# Patient Record
Sex: Male | Born: 1945 | Race: White | Hispanic: No | Marital: Married | Smoking: Never smoker
Health system: Southern US, Community
[De-identification: ages and names within clinical notes are randomized; demographics above are authoritative.]

## PROBLEM LIST (undated history)

## (undated) DIAGNOSIS — I10 Essential (primary) hypertension: Secondary | ICD-10-CM

## (undated) DIAGNOSIS — I499 Cardiac arrhythmia, unspecified: Secondary | ICD-10-CM

## (undated) DIAGNOSIS — L0390 Cellulitis, unspecified: Secondary | ICD-10-CM

## (undated) DIAGNOSIS — Z9889 Other specified postprocedural states: Secondary | ICD-10-CM

## (undated) DIAGNOSIS — I2510 Atherosclerotic heart disease of native coronary artery without angina pectoris: Secondary | ICD-10-CM

## (undated) DIAGNOSIS — S8992XA Unspecified injury of left lower leg, initial encounter: Secondary | ICD-10-CM

## (undated) DIAGNOSIS — I82409 Acute embolism and thrombosis of unspecified deep veins of unspecified lower extremity: Secondary | ICD-10-CM

## (undated) DIAGNOSIS — K219 Gastro-esophageal reflux disease without esophagitis: Secondary | ICD-10-CM

## (undated) DIAGNOSIS — G20A1 Parkinson's disease without dyskinesia, without mention of fluctuations: Secondary | ICD-10-CM

## (undated) DIAGNOSIS — J302 Other seasonal allergic rhinitis: Secondary | ICD-10-CM

## (undated) DIAGNOSIS — Z8679 Personal history of other diseases of the circulatory system: Secondary | ICD-10-CM

## (undated) DIAGNOSIS — J45909 Unspecified asthma, uncomplicated: Secondary | ICD-10-CM

## (undated) DIAGNOSIS — E119 Type 2 diabetes mellitus without complications: Secondary | ICD-10-CM

## (undated) DIAGNOSIS — I219 Acute myocardial infarction, unspecified: Secondary | ICD-10-CM

## (undated) DIAGNOSIS — G20 Parkinson's disease: Secondary | ICD-10-CM

## (undated) DIAGNOSIS — Z950 Presence of cardiac pacemaker: Secondary | ICD-10-CM

## (undated) HISTORY — PX: CARDIAC CATHETERIZATION: SHX172

## (undated) HISTORY — PX: HERNIA REPAIR: SHX51

## (undated) HISTORY — PX: APPENDECTOMY: SHX54

## (undated) HISTORY — PX: COLONOSCOPY WITH PROPOFOL: SHX5780

## (undated) HISTORY — PX: IVC FILTER PLACEMENT (ARMC HX): HXRAD1551

## (undated) HISTORY — PX: CORONARY ARTERY BYPASS GRAFT: SHX141

## (undated) HISTORY — PX: CAROTID ENDARTERECTOMY: SUR193

## (undated) HISTORY — PX: VASCULAR SURGERY: SHX849

---

## 2008-01-15 ENCOUNTER — Ambulatory Visit: Payer: Self-pay | Admitting: Gastroenterology

## 2010-09-13 ENCOUNTER — Inpatient Hospital Stay: Payer: Self-pay | Admitting: Internal Medicine

## 2010-09-28 ENCOUNTER — Other Ambulatory Visit: Payer: Self-pay | Admitting: Internal Medicine

## 2010-12-09 HISTORY — PX: PACEMAKER INSERTION: SHX728

## 2011-08-23 ENCOUNTER — Inpatient Hospital Stay: Payer: Self-pay | Admitting: Internal Medicine

## 2011-10-15 ENCOUNTER — Encounter: Payer: Self-pay | Admitting: Cardiology

## 2011-11-09 ENCOUNTER — Encounter: Payer: Self-pay | Admitting: Cardiology

## 2011-11-12 DIAGNOSIS — I82409 Acute embolism and thrombosis of unspecified deep veins of unspecified lower extremity: Secondary | ICD-10-CM | POA: Insufficient documentation

## 2011-11-12 DIAGNOSIS — I495 Sick sinus syndrome: Secondary | ICD-10-CM | POA: Insufficient documentation

## 2011-11-12 DIAGNOSIS — K635 Polyp of colon: Secondary | ICD-10-CM | POA: Insufficient documentation

## 2011-11-12 DIAGNOSIS — I4892 Unspecified atrial flutter: Secondary | ICD-10-CM | POA: Insufficient documentation

## 2011-11-12 DIAGNOSIS — I480 Paroxysmal atrial fibrillation: Secondary | ICD-10-CM | POA: Insufficient documentation

## 2011-11-12 DIAGNOSIS — Z86718 Personal history of other venous thrombosis and embolism: Secondary | ICD-10-CM | POA: Insufficient documentation

## 2011-11-12 DIAGNOSIS — J45909 Unspecified asthma, uncomplicated: Secondary | ICD-10-CM | POA: Insufficient documentation

## 2011-11-12 DIAGNOSIS — K2100 Gastro-esophageal reflux disease with esophagitis, without bleeding: Secondary | ICD-10-CM | POA: Insufficient documentation

## 2011-11-12 DIAGNOSIS — J449 Chronic obstructive pulmonary disease, unspecified: Secondary | ICD-10-CM | POA: Insufficient documentation

## 2011-11-12 DIAGNOSIS — I2510 Atherosclerotic heart disease of native coronary artery without angina pectoris: Secondary | ICD-10-CM | POA: Insufficient documentation

## 2011-11-12 DIAGNOSIS — E1122 Type 2 diabetes mellitus with diabetic chronic kidney disease: Secondary | ICD-10-CM | POA: Insufficient documentation

## 2011-11-12 DIAGNOSIS — Z794 Long term (current) use of insulin: Secondary | ICD-10-CM | POA: Insufficient documentation

## 2011-11-12 DIAGNOSIS — Z950 Presence of cardiac pacemaker: Secondary | ICD-10-CM | POA: Insufficient documentation

## 2011-11-12 DIAGNOSIS — E785 Hyperlipidemia, unspecified: Secondary | ICD-10-CM | POA: Insufficient documentation

## 2011-11-12 DIAGNOSIS — R251 Tremor, unspecified: Secondary | ICD-10-CM | POA: Insufficient documentation

## 2011-11-13 DIAGNOSIS — G4733 Obstructive sleep apnea (adult) (pediatric): Secondary | ICD-10-CM | POA: Insufficient documentation

## 2011-12-10 ENCOUNTER — Encounter: Payer: Self-pay | Admitting: Cardiology

## 2012-01-10 ENCOUNTER — Encounter: Payer: Self-pay | Admitting: Cardiology

## 2012-01-31 ENCOUNTER — Other Ambulatory Visit: Payer: Self-pay | Admitting: Vascular Surgery

## 2012-01-31 LAB — CREATININE, SERUM: EGFR (Non-African Amer.): 56 — ABNORMAL LOW

## 2012-02-03 ENCOUNTER — Ambulatory Visit: Payer: Self-pay | Admitting: Vascular Surgery

## 2012-02-07 ENCOUNTER — Encounter: Payer: Self-pay | Admitting: Cardiology

## 2012-03-03 ENCOUNTER — Ambulatory Visit: Payer: Self-pay | Admitting: Neurology

## 2012-03-09 ENCOUNTER — Encounter: Payer: Self-pay | Admitting: Cardiology

## 2012-05-30 ENCOUNTER — Emergency Department: Payer: Self-pay | Admitting: Emergency Medicine

## 2012-05-30 LAB — URINALYSIS, COMPLETE
Bacteria: NONE SEEN
Bilirubin,UR: NEGATIVE
Blood: NEGATIVE
Glucose,UR: NEGATIVE mg/dL (ref 0–75)
Ketone: NEGATIVE
Leukocyte Esterase: NEGATIVE
Nitrite: NEGATIVE
Protein: NEGATIVE
Specific Gravity: 1.014 (ref 1.003–1.030)
Squamous Epithelial: <1
WBC UR: 2 /HPF (ref 0–5)

## 2013-11-30 ENCOUNTER — Ambulatory Visit: Payer: Self-pay | Admitting: Vascular Surgery

## 2013-12-27 ENCOUNTER — Ambulatory Visit: Payer: Self-pay | Admitting: Vascular Surgery

## 2013-12-27 LAB — BASIC METABOLIC PANEL
ANION GAP: 3 — AB (ref 7–16)
BUN: 22 mg/dL — AB (ref 7–18)
CALCIUM: 8.8 mg/dL (ref 8.5–10.1)
CO2: 28 mmol/L (ref 21–32)
Chloride: 103 mmol/L (ref 98–107)
Creatinine: 1.02 mg/dL (ref 0.60–1.30)
EGFR (African American): >60
EGFR (Non-African Amer.): >60
Glucose: 240 mg/dL — ABNORMAL HIGH (ref 65–99)
OSMOLALITY: 279 (ref 275–301)
POTASSIUM: 5 mmol/L (ref 3.5–5.1)
Sodium: 134 mmol/L — ABNORMAL LOW (ref 136–145)

## 2013-12-27 LAB — CBC
HCT: 40.4 % (ref 40.0–52.0)
HGB: 13.4 g/dL (ref 13.0–18.0)
MCH: 30.1 pg (ref 26.0–34.0)
MCHC: 33.1 g/dL (ref 32.0–36.0)
MCV: 91 fL (ref 80–100)
PLATELETS: 171 10*3/uL (ref 150–440)
RBC: 4.44 10*6/uL (ref 4.40–5.90)
RDW: 14.7 % — ABNORMAL HIGH (ref 11.5–14.5)
WBC: 7.8 10*3/uL (ref 3.8–10.6)

## 2014-01-05 ENCOUNTER — Inpatient Hospital Stay: Payer: Self-pay | Admitting: Vascular Surgery

## 2014-01-06 LAB — PATHOLOGY REPORT

## 2014-01-06 LAB — CBC WITH DIFFERENTIAL/PLATELET
Basophil #: 0.1 10*3/uL (ref 0.0–0.1)
Basophil %: 0.5 %
EOS ABS: 0.1 10*3/uL (ref 0.0–0.7)
Eosinophil %: 1.4 %
HCT: 35.3 % — ABNORMAL LOW (ref 40.0–52.0)
HGB: 11.9 g/dL — ABNORMAL LOW (ref 13.0–18.0)
LYMPHS ABS: 1.6 10*3/uL (ref 1.0–3.6)
LYMPHS PCT: 14.8 %
MCH: 30.7 pg (ref 26.0–34.0)
MCHC: 33.8 g/dL (ref 32.0–36.0)
MCV: 91 fL (ref 80–100)
MONO ABS: 0.7 x10 3/mm (ref 0.2–1.0)
Monocyte %: 6.8 %
Neutrophil #: 8 10*3/uL — ABNORMAL HIGH (ref 1.4–6.5)
Neutrophil %: 76.5 %
Platelet: 137 10*3/uL — ABNORMAL LOW (ref 150–440)
RBC: 3.89 10*6/uL — AB (ref 4.40–5.90)
RDW: 14.7 % — AB (ref 11.5–14.5)
WBC: 10.4 10*3/uL (ref 3.8–10.6)

## 2014-01-06 LAB — BASIC METABOLIC PANEL
ANION GAP: 3 — AB (ref 7–16)
BUN: 12 mg/dL (ref 7–18)
CHLORIDE: 105 mmol/L (ref 98–107)
CO2: 28 mmol/L (ref 21–32)
CREATININE: 0.91 mg/dL (ref 0.60–1.30)
Calcium, Total: 7.9 mg/dL — ABNORMAL LOW (ref 8.5–10.1)
Glucose: 147 mg/dL — ABNORMAL HIGH (ref 65–99)
OSMOLALITY: 274 (ref 275–301)
POTASSIUM: 3.8 mmol/L (ref 3.5–5.1)
Sodium: 136 mmol/L (ref 136–145)

## 2014-01-06 LAB — APTT: ACTIVATED PTT: 28.9 s (ref 23.6–35.9)

## 2014-01-06 LAB — PROTIME-INR
INR: 1.1
Prothrombin Time: 14.3 secs (ref 11.5–14.7)

## 2014-04-07 DIAGNOSIS — Z7901 Long term (current) use of anticoagulants: Secondary | ICD-10-CM | POA: Insufficient documentation

## 2014-10-09 DIAGNOSIS — I255 Ischemic cardiomyopathy: Secondary | ICD-10-CM | POA: Insufficient documentation

## 2014-11-01 ENCOUNTER — Observation Stay: Payer: Self-pay | Admitting: Surgery

## 2014-11-10 ENCOUNTER — Encounter: Payer: Self-pay | Admitting: Surgery

## 2014-12-06 ENCOUNTER — Encounter: Payer: Self-pay | Admitting: General Surgery

## 2014-12-09 ENCOUNTER — Encounter: Payer: Self-pay | Admitting: General Surgery

## 2014-12-09 ENCOUNTER — Encounter: Payer: Self-pay | Admitting: Surgery

## 2014-12-27 ENCOUNTER — Encounter: Payer: Self-pay | Admitting: Surgery

## 2014-12-30 ENCOUNTER — Encounter (HOSPITAL_BASED_OUTPATIENT_CLINIC_OR_DEPARTMENT_OTHER): Payer: Self-pay | Admitting: *Deleted

## 2015-01-02 ENCOUNTER — Other Ambulatory Visit: Payer: Self-pay | Admitting: Plastic Surgery

## 2015-01-02 DIAGNOSIS — L97923 Non-pressure chronic ulcer of unspecified part of left lower leg with necrosis of muscle: Secondary | ICD-10-CM

## 2015-01-04 ENCOUNTER — Ambulatory Visit (HOSPITAL_BASED_OUTPATIENT_CLINIC_OR_DEPARTMENT_OTHER): Payer: Medicare PPO | Admitting: Anesthesiology

## 2015-01-04 ENCOUNTER — Encounter (HOSPITAL_BASED_OUTPATIENT_CLINIC_OR_DEPARTMENT_OTHER): Payer: Self-pay | Admitting: Plastic Surgery

## 2015-01-04 ENCOUNTER — Encounter (HOSPITAL_BASED_OUTPATIENT_CLINIC_OR_DEPARTMENT_OTHER)
Admission: RE | Disposition: A | Discharge status: Home / Self Care | Payer: Commercial Managed Care - HMO | Source: Ambulatory Visit | Attending: Plastic Surgery

## 2015-01-04 ENCOUNTER — Ambulatory Visit (HOSPITAL_BASED_OUTPATIENT_CLINIC_OR_DEPARTMENT_OTHER)
Admission: RE | Admit: 2015-01-04 | Discharge: 2015-01-04 | Disposition: A | Discharge status: Home / Self Care | Payer: Medicare PPO | Source: Ambulatory Visit | Attending: Plastic Surgery | Admitting: Plastic Surgery

## 2015-01-04 DIAGNOSIS — J45909 Unspecified asthma, uncomplicated: Secondary | ICD-10-CM | POA: Diagnosis not present

## 2015-01-04 DIAGNOSIS — I739 Peripheral vascular disease, unspecified: Secondary | ICD-10-CM | POA: Insufficient documentation

## 2015-01-04 DIAGNOSIS — G20 Parkinson's disease: Secondary | ICD-10-CM | POA: Diagnosis not present

## 2015-01-04 DIAGNOSIS — G4730 Sleep apnea, unspecified: Secondary | ICD-10-CM | POA: Insufficient documentation

## 2015-01-04 DIAGNOSIS — L97929 Non-pressure chronic ulcer of unspecified part of left lower leg with unspecified severity: Secondary | ICD-10-CM | POA: Diagnosis present

## 2015-01-04 DIAGNOSIS — I10 Essential (primary) hypertension: Secondary | ICD-10-CM | POA: Insufficient documentation

## 2015-01-04 DIAGNOSIS — K219 Gastro-esophageal reflux disease without esophagitis: Secondary | ICD-10-CM | POA: Insufficient documentation

## 2015-01-04 DIAGNOSIS — L97923 Non-pressure chronic ulcer of unspecified part of left lower leg with necrosis of muscle: Secondary | ICD-10-CM

## 2015-01-04 DIAGNOSIS — Z950 Presence of cardiac pacemaker: Secondary | ICD-10-CM | POA: Diagnosis not present

## 2015-01-04 DIAGNOSIS — I2510 Atherosclerotic heart disease of native coronary artery without angina pectoris: Secondary | ICD-10-CM | POA: Insufficient documentation

## 2015-01-04 DIAGNOSIS — I4891 Unspecified atrial fibrillation: Secondary | ICD-10-CM | POA: Insufficient documentation

## 2015-01-04 DIAGNOSIS — Z888 Allergy status to other drugs, medicaments and biological substances status: Secondary | ICD-10-CM | POA: Diagnosis not present

## 2015-01-04 DIAGNOSIS — E119 Type 2 diabetes mellitus without complications: Secondary | ICD-10-CM | POA: Insufficient documentation

## 2015-01-04 DIAGNOSIS — Z86718 Personal history of other venous thrombosis and embolism: Secondary | ICD-10-CM | POA: Insufficient documentation

## 2015-01-04 HISTORY — DX: Acute embolism and thrombosis of unspecified deep veins of unspecified lower extremity: I82.409

## 2015-01-04 HISTORY — PX: APPLICATION OF A-CELL OF EXTREMITY: SHX6303

## 2015-01-04 HISTORY — PX: I & D EXTREMITY: SHX5045

## 2015-01-04 HISTORY — DX: Essential (primary) hypertension: I10

## 2015-01-04 HISTORY — DX: Gastro-esophageal reflux disease without esophagitis: K21.9

## 2015-01-04 HISTORY — DX: Unspecified asthma, uncomplicated: J45.909

## 2015-01-04 HISTORY — DX: Presence of cardiac pacemaker: Z95.0

## 2015-01-04 HISTORY — DX: Type 2 diabetes mellitus without complications: E11.9

## 2015-01-04 HISTORY — DX: Parkinson's disease without dyskinesia, without mention of fluctuations: G20.A1

## 2015-01-04 HISTORY — DX: Other specified postprocedural states: Z98.890

## 2015-01-04 HISTORY — DX: Personal history of other diseases of the circulatory system: Z86.79

## 2015-01-04 HISTORY — DX: Parkinson's disease: G20

## 2015-01-04 LAB — POCT I-STAT, CHEM 8
BUN: 22 mg/dL (ref 6–23)
CALCIUM ION: 1.2 mmol/L (ref 1.13–1.30)
CREATININE: 0.9 mg/dL (ref 0.50–1.35)
Chloride: 101 mmol/L (ref 96–112)
Glucose, Bld: 190 mg/dL — ABNORMAL HIGH (ref 70–99)
HEMATOCRIT: 41 % (ref 39.0–52.0)
HEMOGLOBIN: 13.9 g/dL (ref 13.0–17.0)
Potassium: 4.5 mmol/L (ref 3.5–5.1)
Sodium: 140 mmol/L (ref 135–145)
TCO2: 25 mmol/L (ref 0–100)

## 2015-01-04 LAB — GLUCOSE, CAPILLARY: Glucose-Capillary: 149 mg/dL — ABNORMAL HIGH (ref 70–99)

## 2015-01-04 SURGERY — IRRIGATION AND DEBRIDEMENT EXTREMITY
Anesthesia: General | Site: Leg Lower | Laterality: Left

## 2015-01-04 MED ORDER — OXYCODONE HCL 5 MG/5ML PO SOLN: 5.0000 mg | Freq: Once | ORAL | Status: DC | PRN

## 2015-01-04 MED ORDER — LACTATED RINGERS IV SOLN
INTRAVENOUS | Status: DC
  Administered 2015-01-04: 12:00:00 via INTRAVENOUS

## 2015-01-04 MED ORDER — HYDROMORPHONE HCL 1 MG/ML IJ SOLN: 0.2500 mg | INTRAMUSCULAR | Status: DC | PRN

## 2015-01-04 MED ORDER — PROPOFOL 10 MG/ML IV BOLUS
INTRAVENOUS | Status: DC | PRN
  Administered 2015-01-04: 150 mg via INTRAVENOUS

## 2015-01-04 MED ORDER — FENTANYL CITRATE 0.05 MG/ML IJ SOLN
INTRAMUSCULAR | Status: AC
  Filled 2015-01-04: qty 4

## 2015-01-04 MED ORDER — OXYCODONE HCL 5 MG PO TABS
5.0000 mg | ORAL_TABLET | Freq: Once | ORAL | Status: DC | PRN
Start: 2015-01-04 — End: 2015-01-04

## 2015-01-04 MED ORDER — MIDAZOLAM HCL 2 MG/2ML IJ SOLN: 1.0000 mg | INTRAMUSCULAR | Status: DC | PRN

## 2015-01-04 MED ORDER — MIDAZOLAM HCL 2 MG/2ML IJ SOLN
INTRAMUSCULAR | Status: AC
  Filled 2015-01-04: qty 2

## 2015-01-04 MED ORDER — LIDOCAINE-EPINEPHRINE 1 %-1:100000 IJ SOLN
INTRAMUSCULAR | Status: AC
  Filled 2015-01-04: qty 1

## 2015-01-04 MED ORDER — BUPIVACAINE-EPINEPHRINE (PF) 0.25% -1:200000 IJ SOLN
INTRAMUSCULAR | Status: AC
  Filled 2015-01-04: qty 30

## 2015-01-04 MED ORDER — FENTANYL CITRATE 0.05 MG/ML IJ SOLN
INTRAMUSCULAR | Status: DC | PRN
  Administered 2015-01-04: 50 ug via INTRAVENOUS

## 2015-01-04 MED ORDER — LIDOCAINE HCL (CARDIAC) 20 MG/ML IV SOLN
INTRAVENOUS | Status: DC | PRN
  Administered 2015-01-04: 50 mg via INTRAVENOUS

## 2015-01-04 MED ORDER — CEFAZOLIN SODIUM-DEXTROSE 2-3 GM-% IV SOLR
2.0000 g | INTRAVENOUS | Status: AC
  Administered 2015-01-04: 2 g via INTRAVENOUS

## 2015-01-04 MED ORDER — MEPERIDINE HCL 25 MG/ML IJ SOLN: 6.2500 mg | INTRAMUSCULAR | Status: DC | PRN

## 2015-01-04 MED ORDER — ONDANSETRON HCL 4 MG/2ML IJ SOLN
INTRAMUSCULAR | Status: DC | PRN
  Administered 2015-01-04: 4 mg via INTRAVENOUS

## 2015-01-04 MED ORDER — FENTANYL CITRATE 0.05 MG/ML IJ SOLN: 50.0000 ug | INTRAMUSCULAR | Status: DC | PRN

## 2015-01-04 MED ORDER — PROMETHAZINE HCL 25 MG/ML IJ SOLN: 6.2500 mg | INTRAMUSCULAR | Status: DC | PRN

## 2015-01-04 MED ORDER — MIDAZOLAM HCL 5 MG/5ML IJ SOLN
INTRAMUSCULAR | Status: DC | PRN
  Administered 2015-01-04: 1 mg via INTRAVENOUS

## 2015-01-04 MED ORDER — SODIUM CHLORIDE 0.9 % IR SOLN
Status: DC | PRN
  Administered 2015-01-04: 500 mL

## 2015-01-04 SURGICAL SUPPLY — 84 items
BAG DECANTER FOR FLEXI CONT (MISCELLANEOUS) ×2 IMPLANT
BANDAGE ELASTIC 3 VELCRO ST LF (GAUZE/BANDAGES/DRESSINGS) IMPLANT
BANDAGE ELASTIC 4 VELCRO ST LF (GAUZE/BANDAGES/DRESSINGS) ×4 IMPLANT
BANDAGE ELASTIC 6 VELCRO ST LF (GAUZE/BANDAGES/DRESSINGS) IMPLANT
BENZOIN TINCTURE PRP APPL 2/3 (GAUZE/BANDAGES/DRESSINGS) IMPLANT
BLADE HEX COATED 2.75 (ELECTRODE) IMPLANT
BLADE MINI RND TIP GREEN BEAV (BLADE) IMPLANT
BLADE SURG 10 STRL SS (BLADE) ×2 IMPLANT
BLADE SURG 15 STRL LF DISP TIS (BLADE) ×1 IMPLANT
BLADE SURG 15 STRL SS (BLADE) ×1
BNDG COHESIVE 1X5 TAN STRL LF (GAUZE/BANDAGES/DRESSINGS) IMPLANT
BNDG COHESIVE 4X5 TAN STRL (GAUZE/BANDAGES/DRESSINGS) IMPLANT
BNDG ESMARK 4X9 LF (GAUZE/BANDAGES/DRESSINGS) IMPLANT
BNDG GAUZE ELAST 4 BULKY (GAUZE/BANDAGES/DRESSINGS) IMPLANT
CANISTER SUCT 1200ML W/VALVE (MISCELLANEOUS) ×2 IMPLANT
CANISTER SUCT 3000ML (MISCELLANEOUS) IMPLANT
CHLORAPREP W/TINT 26ML (MISCELLANEOUS) IMPLANT
CORDS BIPOLAR (ELECTRODE) IMPLANT
COVER BACK TABLE 60X90IN (DRAPES) ×2 IMPLANT
COVER MAYO STAND STRL (DRAPES) ×2 IMPLANT
DECANTER SPIKE VIAL GLASS SM (MISCELLANEOUS) IMPLANT
DRAIN PENROSE 1/2X12 LTX STRL (WOUND CARE) IMPLANT
DRAPE EXTREMITY T 121X128X90 (DRAPE) IMPLANT
DRAPE INCISE IOBAN 66X45 STRL (DRAPES) ×2 IMPLANT
DRAPE U-SHAPE 76X120 STRL (DRAPES) ×2 IMPLANT
DRSG ADAPTIC 3X8 NADH LF (GAUZE/BANDAGES/DRESSINGS) ×2 IMPLANT
DRSG EMULSION OIL 3X3 NADH (GAUZE/BANDAGES/DRESSINGS) IMPLANT
DRSG PAD ABDOMINAL 8X10 ST (GAUZE/BANDAGES/DRESSINGS) IMPLANT
ELECT NEEDLE TIP 2.8 STRL (NEEDLE) IMPLANT
ELECT REM PT RETURN 9FT ADLT (ELECTROSURGICAL)
ELECTRODE REM PT RTRN 9FT ADLT (ELECTROSURGICAL) IMPLANT
GAUZE SPONGE 4X4 12PLY STRL (GAUZE/BANDAGES/DRESSINGS) ×2 IMPLANT
GAUZE XEROFORM 1X8 LF (GAUZE/BANDAGES/DRESSINGS) IMPLANT
GAUZE XEROFORM 5X9 LF (GAUZE/BANDAGES/DRESSINGS) IMPLANT
GLOVE BIO SURGEON STRL SZ 6.5 (GLOVE) ×4 IMPLANT
GLOVE SURG SS PI 7.0 STRL IVOR (GLOVE) ×2 IMPLANT
GOWN STRL REUS W/ TWL LRG LVL3 (GOWN DISPOSABLE) ×3 IMPLANT
GOWN STRL REUS W/TWL LRG LVL3 (GOWN DISPOSABLE) ×3
IV NS IRRIG 3000ML ARTHROMATIC (IV SOLUTION) IMPLANT
MANIFOLD NEPTUNE II (INSTRUMENTS) IMPLANT
MATRIX SURGICAL PSMX 10X15CM (Tissue) ×2 IMPLANT
MICROMATRIX 1000MG (Tissue) ×2 IMPLANT
NEEDLE HYPO 30GX1 BEV (NEEDLE) IMPLANT
NEEDLE PRECISIONGLIDE 27X1.5 (NEEDLE) ×2 IMPLANT
NS IRRIG 1000ML POUR BTL (IV SOLUTION) ×2 IMPLANT
PACK BASIN DAY SURGERY FS (CUSTOM PROCEDURE TRAY) ×2 IMPLANT
PADDING CAST ABS 3INX4YD NS (CAST SUPPLIES)
PADDING CAST ABS 4INX4YD NS (CAST SUPPLIES)
PADDING CAST ABS COTTON 3X4 (CAST SUPPLIES) IMPLANT
PADDING CAST ABS COTTON 4X4 ST (CAST SUPPLIES) IMPLANT
PENCIL BUTTON HOLSTER BLD 10FT (ELECTRODE) IMPLANT
SHEET MEDIUM DRAPE 40X70 STRL (DRAPES) ×2 IMPLANT
SLEEVE SCD COMPRESS KNEE MED (MISCELLANEOUS) ×2 IMPLANT
SOLUTION PARTIC MCRMTRX 1000MG (Tissue) ×1 IMPLANT
SPLINT PLASTER CAST XFAST 3X15 (CAST SUPPLIES) IMPLANT
SPLINT PLASTER XTRA FASTSET 3X (CAST SUPPLIES)
SPONGE GAUZE 4X4 12PLY STER LF (GAUZE/BANDAGES/DRESSINGS) IMPLANT
SPONGE LAP 18X18 X RAY DECT (DISPOSABLE) ×4 IMPLANT
SPONGE LAP 4X18 X RAY DECT (DISPOSABLE) IMPLANT
STAPLER VISISTAT 35W (STAPLE) IMPLANT
STOCKINETTE 4X48 STRL (DRAPES) IMPLANT
STOCKINETTE 6  STRL (DRAPES) ×1
STOCKINETTE 6 STRL (DRAPES) ×1 IMPLANT
STOCKINETTE IMPERVIOUS LG (DRAPES) IMPLANT
STRIP CLOSURE SKIN 1/2X4 (GAUZE/BANDAGES/DRESSINGS) IMPLANT
SUCTION FRAZIER TIP 10 FR DISP (SUCTIONS) IMPLANT
SURGILUBE 2OZ TUBE FLIPTOP (MISCELLANEOUS) ×2 IMPLANT
SUT ETHILON 3 0 PS 1 (SUTURE) IMPLANT
SUT ETHILON 4 0 P 3 18 (SUTURE) IMPLANT
SUT ETHILON 5 0 PS 2 18 (SUTURE) IMPLANT
SUT PROLENE 3 0 PS 2 (SUTURE) IMPLANT
SUT SILK 3 0 PS 1 (SUTURE) ×2 IMPLANT
SUT VIC AB 3-0 FS2 27 (SUTURE) IMPLANT
SUT VIC AB 5-0 P-3 18X BRD (SUTURE) IMPLANT
SUT VIC AB 5-0 P3 18 (SUTURE)
SUT VIC AB 5-0 PS2 18 (SUTURE) ×6 IMPLANT
SYR BULB IRRIGATION 50ML (SYRINGE) ×2 IMPLANT
SYR CONTROL 10ML LL (SYRINGE) ×2 IMPLANT
TAPE HYPAFIX 6X30 (GAUZE/BANDAGES/DRESSINGS) IMPLANT
TOWEL OR 17X24 6PK STRL BLUE (TOWEL DISPOSABLE) ×2 IMPLANT
TRAY DSU PREP LF (CUSTOM PROCEDURE TRAY) ×2 IMPLANT
TUBE CONNECTING 20X1/4 (TUBING) ×2 IMPLANT
UNDERPAD 30X30 INCONTINENT (UNDERPADS AND DIAPERS) ×2 IMPLANT
YANKAUER SUCT BULB TIP NO VENT (SUCTIONS) ×2 IMPLANT

## 2015-01-04 NOTE — Discharge Instructions (Signed)
Do not remove VAC     Post Anesthesia Home Care Instructions  Activity: Get plenty of rest for the remainder of the day. A responsible adult should stay with you for 24 hours following the procedure.  For the next 24 hours, DO NOT: -Drive a car -Advertising copywriterperate machinery -Drink alcoholic beverages -Take any medication unless instructed by your physician -Make any legal decisions or sign important papers.  Meals: Start with liquid foods such as gelatin or soup. Progress to regular foods as tolerated. Avoid greasy, spicy, heavy foods. If nausea and/or vomiting occur, drink only clear liquids until the nausea and/or vomiting subsides. Call your physician if vomiting continues.  Special Instructions/Symptoms: Your throat may feel dry or sore from the anesthesia or the breathing tube placed in your throat during surgery. If this causes discomfort, gargle with warm salt water. The discomfort should disappear within 24 hours.   Call your surgeon if you experience:   1.  Fever over 101.0. 2.  Inability to urinate. 3.  Nausea and/or vomiting. 4.  Extreme swelling or bruising at the surgical site. 5.  Continued bleeding from the incision. 6.  Increased pain, redness or drainage from the incision. 7.  Problems related to your pain medication. 8. Any change in color, movement and/or sensation 9. Any problems and/or concerns

## 2015-01-04 NOTE — Interval H&P Note (Signed)
History and Physical Interval Note:  01/04/2015 11:19 AM  Ian [name]  has presented today for surgery, with the diagnosis of LOWER LEFT LEG ULCER  The various methods of treatment have been discussed with the patient and family. After consideration of risks, benefits and other options for treatment, the patient has consented to  Procedure(s): IRRIGATION AND DEBRIDEMENT OF LOWER LEFT LEG WOUND WITH A CELL AND VAC (Left) APPLICATION OF A-CELL  AND VAC  (Left) as a surgical intervention .  The patient's history has been reviewed, patient examined, no change in status, stable for surgery.  I have reviewed the patient's chart and labs.  Questions were answered to the patient's satisfaction.     SANGER,Nataleah Scioneaux

## 2015-01-04 NOTE — Op Note (Signed)
Operative Note   DATE OF OPERATION: 01/04/2015  [facility]  SURGICAL DIVISION: Plastic Surgery  PREOPERATIVE DIAGNOSES:  Left lateral leg ulcer  POSTOPERATIVE DIAGNOSES:  same  PROCEDURE:  Preparation of left lateral leg ulcer 20 x 5 x .4 cm for placement of Acell (powder 1 gm and 10 x 15 cm sheet)  SURGEON: Wayland Denislaire Sanger, DO  ANESTHESIA:  General.   COMPLICATIONS: None.   INDICATIONS FOR PROCEDURE:  The patient, Ian [name] is a 69 y.o. male born on 1946-04-24, is here for treatment of left leg ulcer. MRN: [id number]  CONSENT:  Informed consent was obtained directly from the patient. Risks, benefits and alternatives were fully discussed. Specific risks including but not limited to bleeding, infection, hematoma, seroma, scarring, pain, infection, contracture, asymmetry, wound healing problems, and need for further surgery were all discussed. The patient did have an ample opportunity to have questions answered to satisfaction.   DESCRIPTION OF PROCEDURE:  The patient was taken to the operating room. SCDs were placed and IV antibiotics were given. The patient's operative site was prepped and draped in a sterile fashion. A time out was performed and all information was confirmed to be correct.  General anesthesia was administered.  The currette and #10 blade were used to debride the leg wound.  The area (20 x 5 cm)was irrigated with antibiotic solution.  The Acell powder (1 gm) and Sheet (10 x 15 cm) were applied and secured with 5-0 Vicryl.  The adaptic was placed with surgical lube and a VAC dressing.  We obtained an excellent seal.   The patient tolerated the procedure well.  There were no complications. The patient was allowed to wake from anesthesia, extubated and taken to the recovery room in satisfactory condition.

## 2015-01-04 NOTE — Anesthesia Preprocedure Evaluation (Addendum)
Anesthesia Evaluation  Patient identified by MRN, date of birth, ID band Patient awake    Reviewed: Allergy & Precautions, NPO status , Patient's Chart, lab work & pertinent test results  Airway Mallampati: II  TM Distance: >3 FB Neck ROM: Full    Dental no notable dental hx.    Pulmonary asthma , sleep apnea , COPD breath sounds clear to auscultation  Pulmonary exam normal       Cardiovascular hypertension, Pt. on home beta blockers and Pt. on medications + CAD, + CABG and + Peripheral Vascular Disease + pacemaker Rhythm:Regular Rate:Normal  [employer], DDDR For SSS, interrogation 08/2014  Estimated Longevity: 5.34YRS Pacing RA: 35 % RV: 64 %       Neuro/Psych negative neurological ROS  negative psych ROS   GI/Hepatic Neg liver ROS, GERD-  Medicated,  Endo/Other  negative endocrine ROSdiabetes, Type 2, Oral Hypoglycemic Agents  Renal/GU negative Renal ROS     Musculoskeletal negative musculoskeletal ROS (+)   Abdominal   Peds  Hematology negative hematology ROS (+)   Anesthesia Other Findings   Reproductive/Obstetrics                           Anesthesia Physical Anesthesia Plan  ASA: III  Anesthesia Plan: General   Post-op Pain Management:    Induction: Intravenous  Airway Management Planned: LMA  Additional Equipment: None  Intra-op Plan:   Post-operative Plan: Extubation in OR  Informed Consent: I have reviewed the patients History and Physical, chart, labs and discussed the procedure including the risks, benefits and alternatives for the proposed anesthesia with the patient or authorized representative who has indicated his/her understanding and acceptance.   Dental advisory given  Plan Discussed with: CRNA  Anesthesia Plan Comments:         Anesthesia Quick Evaluation

## 2015-01-04 NOTE — Brief Op Note (Signed)
01/04/2015  1:28 PM  PATIENT:  Era Bumpersharles A Espin  69 y.o. male  PRE-OPERATIVE DIAGNOSIS:  LOWER LEFT LEG ULCER  POST-OPERATIVE DIAGNOSIS:  LOWER LEFT LEG ULCER  PROCEDURE:  Procedure(s): IRRIGATION AND DEBRIDEMENT OF LOWER LEFT LEG WOUND WITH A CELL AND VAC (Left) APPLICATION OF A-CELL  AND VAC  (Left)  SURGEON:  Surgeon(s) and Role:    * Claire Sanger, DO - Primary  PHYSICIAN ASSISTANT: none  ASSISTANTS: none   ANESTHESIA:   general  EBL:  Total I/O In: 500 [I.V.:500] Out: -   BLOOD ADMINISTERED:none  DRAINS: none   LOCAL MEDICATIONS USED:  NONE  SPECIMEN:  No Specimen  DISPOSITION OF SPECIMEN:  N/A  COUNTS:  YES  TOURNIQUET:  * No tourniquets in log *  DICTATION: .Dragon Dictation  PLAN OF CARE: Discharge to home after PACU  PATIENT DISPOSITION:  PACU - hemodynamically stable.   Delay start of Pharmacological VTE agent (>24hrs) due to surgical blood loss or risk of bleeding: no

## 2015-01-04 NOTE — Transfer of Care (Signed)
Immediate Anesthesia Transfer of Care Note  Patient: Ian [name]  Procedure(s) Performed: Procedure(s): IRRIGATION AND DEBRIDEMENT OF LOWER LEFT LEG WOUND WITH A CELL AND VAC (Left) APPLICATION OF A-CELL  AND VAC  (Left)  Patient Location: PACU  Anesthesia Type:General  Level of Consciousness: sedated  Airway & Oxygen Therapy: Patient Spontanous Breathing and Patient connected to face mask oxygen  Post-op Assessment: Report given to PACU RN and Post -op Vital signs reviewed and stable  Post vital signs: Reviewed and stable  Complications: No apparent anesthesia complications

## 2015-01-04 NOTE — H&P (Signed)
[name] is an 69 y.o. male.   Chief Complaint: left leg ulcer HPI:The patient is a 69 yrs old wm here for treatment of a chronic left leg ulcer that occurred with a trauma from a tire falling on his leg. This happened one year ago and he has been receiving local care at the wound care center in [place]. He has Parkinson's Disease and underwent open heart surgery. He has been doing wet to dry dressings on the area and recently he has been using the VAC. It is ~ 6 x 16 cm and 1 cm deep. There is no sign of infection. The pulse of his foot is palpable but evidence of hemosiderosis and vascular disease.  Past Medical History  Diagnosis Date  . Hypertension   . 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 GERD (gastroesophageal reflux disease)   . Sleep apnea     no CPAP since open heart surgery 2 yrs ago  . Asthma     uses Symbicort  . Parkinson's disease   . DVT of leg (deep venous thrombosis)     left  . Pacemaker   . History of atrial fibrillation   . History of CEA (carotid endarterectomy)     left side    Past Surgical History  Procedure Laterality Date  . Coronary artery bypass graft    . Vascular surgery    . Appendectomy    . Carotid endarterectomy Left   . Pacemaker insertion    . Hernia repair      UHR    History reviewed. No pertinent family history. Social History:  reports that he has never smoked. He does not have any smokeless tobacco history on file. He reports that he does not drink alcohol or use illicit drugs.  Allergies:  Allergies  Allergen Reactions  . Lipitor [Atorvastatin] Other (See Comments)    Muscle aches    No prescriptions prior to admission    No results found for this or any previous visit (from the past 48 hour(s)). No results found.  Review of Systems  Constitutional: Negative.   Eyes: Negative.   Respiratory: Negative.   Cardiovascular: Negative.   Genitourinary: Negative.   Musculoskeletal:  Negative.   Skin: Negative.   Psychiatric/Behavioral: Negative.     Height 6\' 5"  (1.956 m), weight 107.956 kg (238 lb). Physical Exam  Constitutional: He appears well-developed.  HENT:  Head: Normocephalic and atraumatic.  Eyes: Pupils are equal, round, and reactive to light.  Respiratory: Effort normal.  Neurological: He is alert.  Psychiatric: He has a normal mood and affect. His behavior is normal.     Assessment/Plan Debridement of left leg ulcer with possible Acell and VAC placement.  SANGER,Porschia Willbanks 01/04/2015, 7:38 AM

## 2015-01-04 NOTE — Anesthesia Postprocedure Evaluation (Signed)
Anesthesia Post Note  Patient: Ian [name]  Procedure(s) Performed: Procedure(s) (LRB): IRRIGATION AND DEBRIDEMENT OF LOWER LEFT LEG WOUND WITH A CELL AND VAC (Left) APPLICATION OF A-CELL  AND VAC  (Left)  Anesthesia type: General  Patient location: PACU  Post pain: Pain level controlled  Post assessment: Post-op Vital signs reviewed  Last Vitals: BP 138/71 mmHg  Pulse 78  Temp(Src) 36.5 C (Oral)  Resp 13  Ht 6\' 5"  (1.956 m)  Wt 238 lb 6 oz (108.126 kg)  BMI 28.26 kg/m2  SpO2 100%  Post vital signs: Reviewed  Level of consciousness: sedated  Complications: No apparent anesthesia complications

## 2015-01-04 NOTE — Anesthesia Procedure Notes (Signed)
Procedure Name: LMA Insertion Date/Time: 01/04/2015 12:47 PM Performed by: Caren MacadamARTER, Chamari Cutbirth W Pre-anesthesia Checklist: Patient identified, Emergency Drugs available, Suction available and Patient being monitored Patient Re-evaluated:Patient Re-evaluated prior to inductionOxygen Delivery Method: Circle System Utilized Preoxygenation: Pre-oxygenation with 100% oxygen Intubation Type: IV induction Ventilation: Mask ventilation without difficulty LMA: LMA inserted LMA Size: 4.0 Number of attempts: 1 Airway Equipment and Method: Bite block Placement Confirmation: positive ETCO2 and breath sounds checked- equal and bilateral Tube secured with: Tape Dental Injury: Teeth and Oropharynx as per pre-operative assessment

## 2015-01-05 ENCOUNTER — Encounter (HOSPITAL_BASED_OUTPATIENT_CLINIC_OR_DEPARTMENT_OTHER): Payer: Self-pay | Admitting: Plastic Surgery

## 2015-01-16 ENCOUNTER — Encounter (HOSPITAL_BASED_OUTPATIENT_CLINIC_OR_DEPARTMENT_OTHER): Payer: Medicare PPO | Attending: Plastic Surgery

## 2015-01-16 DIAGNOSIS — L97922 Non-pressure chronic ulcer of unspecified part of left lower leg with fat layer exposed: Secondary | ICD-10-CM | POA: Diagnosis not present

## 2015-01-16 DIAGNOSIS — I872 Venous insufficiency (chronic) (peripheral): Secondary | ICD-10-CM | POA: Insufficient documentation

## 2015-01-17 NOTE — Progress Notes (Signed)
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 He had debridement with ACell placement last week and is doing very well.  The ACell is still in place.  It is starting to incorporate and he is granulating underneath it.  We will continue with the VAC change, elevation, vitamins, protein, and follow up in 1 week.     Wayland Denislaire Sanger, DO     CS/MEDQ  D:  01/16/2015  T:  01/17/2015  Job:  [id number]

## 2015-01-23 DIAGNOSIS — I872 Venous insufficiency (chronic) (peripheral): Secondary | ICD-10-CM | POA: Diagnosis not present

## 2015-01-23 DIAGNOSIS — L97922 Non-pressure chronic ulcer of unspecified part of left lower leg with fat layer exposed: Secondary | ICD-10-CM | POA: Diagnosis not present

## 2015-01-24 NOTE — Progress Notes (Signed)
[facility]  NAME:  Ian [name], Ian [name]             ACCOUNT NO.:  [id number]  MEDICAL RECORD NO.:  [id number]      DATE OF BIRTH:  1946/11/11  PHYSICIAN:  Wayland Denislaire Sanger, DO       VISIT DATE:  01/23/2015                                  OFFICE VISIT   The patient is a 69 year old male, who is here for a followup on his left lower extremity and  chronic venous insufficiency ulcer.  He underwent debridement with ACell placement and the VAC, and he is overall doing very well.  The ACell is still in place in some places and incorporating, and in some places he has good granulation tissue.  So, we will continue with  the VAC.  The swelling and redness has improved, and he is keeping it elevated, so we will see him back in 1 week.     Wayland Denislaire Sanger, DO     CS/MEDQ  D:  01/23/2015  T:  01/24/2015  Job:  [id number]

## 2015-01-30 DIAGNOSIS — L97922 Non-pressure chronic ulcer of unspecified part of left lower leg with fat layer exposed: Secondary | ICD-10-CM | POA: Diagnosis not present

## 2015-01-30 DIAGNOSIS — I872 Venous insufficiency (chronic) (peripheral): Secondary | ICD-10-CM | POA: Diagnosis not present

## 2015-01-31 ENCOUNTER — Encounter (HOSPITAL_BASED_OUTPATIENT_CLINIC_OR_DEPARTMENT_OTHER): Payer: Self-pay | Admitting: *Deleted

## 2015-01-31 NOTE — Progress Notes (Signed)
Pt was here a month ago-did well-has pacer-sent form to dr Lady Garyfath

## 2015-02-01 NOTE — H&P (Signed)
Ian BumpersCharles A [name] is an 69 y.o. male.   Chief Complaint:open wound left leg HPI: Chronic left leg ulcer that occurred with a trauma from a tire falling on his leg over a year ago. [facility]. Most recently had application A Cell and NWPT instituted. No compression to date. Here for STSG to leg. Has had this previously over right lower extremity.   Past Medical History  Diagnosis Date  . Hypertension   . 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 GERD (gastroesophageal reflux disease)   . Asthma     uses Symbicort  . Parkinson's disease   . DVT of leg (deep venous thrombosis)     left  . Pacemaker   . History of atrial fibrillation   . History of CEA (carotid endarterectomy)     left side  . Coronary artery disease   . Myocardial infarction   . Sleep apnea     no CPAP since open heart surgery 2 yrs ago-was 275lb    Past Surgical History  Procedure Laterality Date  . Coronary artery bypass graft    . Vascular surgery    . Appendectomy    . Carotid endarterectomy Left   . Pacemaker insertion  2012    sss  . Hernia repair      UHR  . I&d extremity Left 01/04/2015    Procedure: IRRIGATION AND DEBRIDEMENT OF LOWER LEFT LEG WOUND WITH A CELL AND VAC;  Surgeon: Wayland Denislaire Sanger, DO;  Location: [facility];  Service: Plastics;  Laterality: Left;  . Application of a-cell of extremity Left 01/04/2015    Procedure: APPLICATION OF A-CELL  AND VAC ;  Surgeon: Wayland Denislaire Sanger, DO;  Location: [facility];  Service: Plastics;  Laterality: Left;    History reviewed. No pertinent family history. Social History:  reports that he has never smoked. He does not have any smokeless tobacco history on file. He reports that he does not drink alcohol or use illicit drugs.  Allergies:  Allergies  Allergen Reactions  . Lipitor [Atorvastatin] Other (See Comments)    Muscle aches  . Zetia [Ezetimibe]     No prescriptions  prior to admission     ROS  Height 6\' 5"  (1.956 m), weight 107.956 kg (238 lb). Physical Exam  Cardiovascular: Normal rate and regular rhythm.   Respiratory: Effort normal and breath sounds normal.  GI: Soft.  Musculoskeletal:  Left lateral leg open wound granulated min slough Non pitting edema     Assessment/Plan Plan skin graft from left leg with use of NPWT post procedure. Reviewed risks anesthesia, scar, continued wound healing problems, failure graft, bleeding, cardiopulmonary complications.   Glenna FellowsBrinda Davidjames Blansett, MD [facility] Plastic & Reconstructive Surgery [phone]

## 2015-02-02 NOTE — Progress Notes (Signed)
Chart reviewed by dr Phoebe PerchHoderine ok for pt to come and evaluate dos

## 2015-02-03 ENCOUNTER — Encounter (HOSPITAL_BASED_OUTPATIENT_CLINIC_OR_DEPARTMENT_OTHER)
Admission: RE | Disposition: A | Discharge status: Home / Self Care | Payer: Self-pay | Source: Ambulatory Visit | Attending: Plastic Surgery

## 2015-02-03 ENCOUNTER — Ambulatory Visit (HOSPITAL_BASED_OUTPATIENT_CLINIC_OR_DEPARTMENT_OTHER): Payer: Medicare PPO | Admitting: Anesthesiology

## 2015-02-03 ENCOUNTER — Encounter (HOSPITAL_BASED_OUTPATIENT_CLINIC_OR_DEPARTMENT_OTHER): Payer: Self-pay | Admitting: Anesthesiology

## 2015-02-03 ENCOUNTER — Ambulatory Visit (HOSPITAL_BASED_OUTPATIENT_CLINIC_OR_DEPARTMENT_OTHER)
Admission: RE | Admit: 2015-02-03 | Discharge: 2015-02-03 | Disposition: A | Discharge status: Home / Self Care | Payer: Medicare PPO | Source: Ambulatory Visit | Attending: Plastic Surgery | Admitting: Plastic Surgery

## 2015-02-03 DIAGNOSIS — Y929 Unspecified place or not applicable: Secondary | ICD-10-CM | POA: Insufficient documentation

## 2015-02-03 DIAGNOSIS — I4891 Unspecified atrial fibrillation: Secondary | ICD-10-CM | POA: Diagnosis not present

## 2015-02-03 DIAGNOSIS — I2510 Atherosclerotic heart disease of native coronary artery without angina pectoris: Secondary | ICD-10-CM | POA: Insufficient documentation

## 2015-02-03 DIAGNOSIS — Z86718 Personal history of other venous thrombosis and embolism: Secondary | ICD-10-CM | POA: Diagnosis not present

## 2015-02-03 DIAGNOSIS — J45909 Unspecified asthma, uncomplicated: Secondary | ICD-10-CM | POA: Insufficient documentation

## 2015-02-03 DIAGNOSIS — I10 Essential (primary) hypertension: Secondary | ICD-10-CM | POA: Diagnosis not present

## 2015-02-03 DIAGNOSIS — S71102A Unspecified open wound, left thigh, initial encounter: Secondary | ICD-10-CM | POA: Insufficient documentation

## 2015-02-03 DIAGNOSIS — G4730 Sleep apnea, unspecified: Secondary | ICD-10-CM | POA: Insufficient documentation

## 2015-02-03 DIAGNOSIS — I252 Old myocardial infarction: Secondary | ICD-10-CM | POA: Insufficient documentation

## 2015-02-03 DIAGNOSIS — E119 Type 2 diabetes mellitus without complications: Secondary | ICD-10-CM | POA: Diagnosis not present

## 2015-02-03 DIAGNOSIS — Z950 Presence of cardiac pacemaker: Secondary | ICD-10-CM | POA: Insufficient documentation

## 2015-02-03 DIAGNOSIS — W228XXA Striking against or struck by other objects, initial encounter: Secondary | ICD-10-CM | POA: Insufficient documentation

## 2015-02-03 DIAGNOSIS — Z888 Allergy status to other drugs, medicaments and biological substances status: Secondary | ICD-10-CM | POA: Insufficient documentation

## 2015-02-03 DIAGNOSIS — K219 Gastro-esophageal reflux disease without esophagitis: Secondary | ICD-10-CM | POA: Diagnosis not present

## 2015-02-03 DIAGNOSIS — G20 Parkinson's disease: Secondary | ICD-10-CM | POA: Insufficient documentation

## 2015-02-03 HISTORY — PX: SKIN SPLIT GRAFT: SHX444

## 2015-02-03 HISTORY — DX: Acute myocardial infarction, unspecified: I21.9

## 2015-02-03 HISTORY — DX: Atherosclerotic heart disease of native coronary artery without angina pectoris: I25.10

## 2015-02-03 LAB — POCT I-STAT, CHEM 8
BUN: 23 mg/dL (ref 6–23)
CHLORIDE: 104 mmol/L (ref 96–112)
Calcium, Ion: 1.18 mmol/L (ref 1.13–1.30)
Creatinine, Ser: 0.8 mg/dL (ref 0.50–1.35)
GLUCOSE: 183 mg/dL — AB (ref 70–99)
HCT: 41 % (ref 39.0–52.0)
Hemoglobin: 13.9 g/dL (ref 13.0–17.0)
Potassium: 4.3 mmol/L (ref 3.5–5.1)
Sodium: 141 mmol/L (ref 135–145)
TCO2: 22 mmol/L (ref 0–100)

## 2015-02-03 LAB — GLUCOSE, CAPILLARY: Glucose-Capillary: 171 mg/dL — ABNORMAL HIGH (ref 70–99)

## 2015-02-03 SURGERY — APPLICATION, GRAFT, SKIN, SPLIT-THICKNESS
Anesthesia: General | Site: Leg Lower | Laterality: Left

## 2015-02-03 MED ORDER — MIDAZOLAM HCL 2 MG/2ML IJ SOLN
INTRAMUSCULAR | Status: AC
  Filled 2015-02-03: qty 2

## 2015-02-03 MED ORDER — LACTATED RINGERS IV SOLN
INTRAVENOUS | Status: DC
  Administered 2015-02-03 (×2): via INTRAVENOUS

## 2015-02-03 MED ORDER — FENTANYL CITRATE 0.05 MG/ML IJ SOLN
INTRAMUSCULAR | Status: DC | PRN
  Administered 2015-02-03 (×2): 50 ug via INTRAVENOUS

## 2015-02-03 MED ORDER — FENTANYL CITRATE 0.05 MG/ML IJ SOLN: 50.0000 ug | INTRAMUSCULAR | Status: DC | PRN

## 2015-02-03 MED ORDER — MINERAL OIL LIGHT 100 % EX OIL
TOPICAL_OIL | CUTANEOUS | Status: AC
  Filled 2015-02-03: qty 25

## 2015-02-03 MED ORDER — CEFAZOLIN SODIUM-DEXTROSE 2-3 GM-% IV SOLR: 2.0000 g | INTRAVENOUS | Status: DC

## 2015-02-03 MED ORDER — CEFAZOLIN SODIUM-DEXTROSE 2-3 GM-% IV SOLR
INTRAVENOUS | Status: AC
  Filled 2015-02-03: qty 50

## 2015-02-03 MED ORDER — ONDANSETRON HCL 4 MG/2ML IJ SOLN
INTRAMUSCULAR | Status: DC | PRN
  Administered 2015-02-03: 4 mg via INTRAVENOUS

## 2015-02-03 MED ORDER — 0.9 % SODIUM CHLORIDE (POUR BTL) OPTIME
TOPICAL | Status: DC | PRN
  Administered 2015-02-03: 400 mL

## 2015-02-03 MED ORDER — CEFAZOLIN SODIUM-DEXTROSE 2-3 GM-% IV SOLR
INTRAVENOUS | Status: DC | PRN
  Administered 2015-02-03: 2 g via INTRAVENOUS

## 2015-02-03 MED ORDER — PHENYLEPHRINE HCL 10 MG/ML IJ SOLN
INTRAMUSCULAR | Status: DC | PRN
  Administered 2015-02-03 (×4): 40 ug via INTRAVENOUS

## 2015-02-03 MED ORDER — FENTANYL CITRATE 0.05 MG/ML IJ SOLN
INTRAMUSCULAR | Status: AC
  Filled 2015-02-03: qty 8

## 2015-02-03 MED ORDER — LIDOCAINE HCL (CARDIAC) 20 MG/ML IV SOLN
INTRAVENOUS | Status: DC | PRN
  Administered 2015-02-03: 50 mg via INTRAVENOUS

## 2015-02-03 MED ORDER — MIDAZOLAM HCL 5 MG/5ML IJ SOLN
INTRAMUSCULAR | Status: DC | PRN
  Administered 2015-02-03: 2 mg via INTRAVENOUS

## 2015-02-03 MED ORDER — MINERAL OIL LIGHT 100 % EX OIL
TOPICAL_OIL | CUTANEOUS | Status: DC | PRN
  Administered 2015-02-03: 1 via TOPICAL

## 2015-02-03 MED ORDER — MIDAZOLAM HCL 2 MG/2ML IJ SOLN: 1.0000 mg | INTRAMUSCULAR | Status: DC | PRN

## 2015-02-03 MED ORDER — PROPOFOL 10 MG/ML IV BOLUS
INTRAVENOUS | Status: DC | PRN
  Administered 2015-02-03: 150 mg via INTRAVENOUS

## 2015-02-03 SURGICAL SUPPLY — 75 items
BANDAGE ELASTIC 3 VELCRO ST LF (GAUZE/BANDAGES/DRESSINGS) IMPLANT
BANDAGE ELASTIC 4 VELCRO ST LF (GAUZE/BANDAGES/DRESSINGS) IMPLANT
BANDAGE ELASTIC 6 VELCRO ST LF (GAUZE/BANDAGES/DRESSINGS) ×2 IMPLANT
BENZOIN TINCTURE PRP APPL 2/3 (GAUZE/BANDAGES/DRESSINGS) IMPLANT
BLADE CLIPPER SURG (BLADE) ×2 IMPLANT
BLADE DERMATOME SS (BLADE) ×2 IMPLANT
BLADE SURG 10 STRL SS (BLADE) ×2 IMPLANT
BLADE SURG 15 STRL LF DISP TIS (BLADE) ×1 IMPLANT
BLADE SURG 15 STRL SS (BLADE) ×1
BNDG COHESIVE 4X5 TAN STRL (GAUZE/BANDAGES/DRESSINGS) ×2 IMPLANT
BNDG GAUZE ELAST 4 BULKY (GAUZE/BANDAGES/DRESSINGS) ×2 IMPLANT
CANISTER SUCT 1200ML W/VALVE (MISCELLANEOUS) IMPLANT
COTTONBALL LRG STERILE PKG (GAUZE/BANDAGES/DRESSINGS) IMPLANT
COVER BACK TABLE 60X90IN (DRAPES) ×2 IMPLANT
COVER MAYO STAND STRL (DRAPES) ×2 IMPLANT
DECANTER SPIKE VIAL GLASS SM (MISCELLANEOUS) IMPLANT
DERMACARRIERS GRAFT 1 TO 1.5 (DISPOSABLE) ×2
DRAPE IMP U-DRAPE 54X76 (DRAPES) ×2 IMPLANT
DRAPE LAPAROTOMY 100X72 PEDS (DRAPES) IMPLANT
DRAPE SURG 17X23 STRL (DRAPES) IMPLANT
DRAPE U-SHAPE 76X120 STRL (DRAPES) ×2 IMPLANT
DRSG ADAPTIC 3X8 NADH LF (GAUZE/BANDAGES/DRESSINGS) ×2 IMPLANT
DRSG EMULSION OIL 3X3 NADH (GAUZE/BANDAGES/DRESSINGS) IMPLANT
DRSG PAD ABDOMINAL 8X10 ST (GAUZE/BANDAGES/DRESSINGS) ×4 IMPLANT
ELECT COATED BLADE 2.86 ST (ELECTRODE) IMPLANT
ELECT NEEDLE BLADE 2-5/6 (NEEDLE) IMPLANT
ELECT REM PT RETURN 9FT ADLT (ELECTROSURGICAL) ×2
ELECTRODE REM PT RTRN 9FT ADLT (ELECTROSURGICAL) ×1 IMPLANT
GAUZE SPONGE 4X4 12PLY STRL (GAUZE/BANDAGES/DRESSINGS) IMPLANT
GAUZE XEROFORM 1X8 LF (GAUZE/BANDAGES/DRESSINGS) IMPLANT
GLOVE BIO SURGEON STRL SZ 6 (GLOVE) ×4 IMPLANT
GLOVE BIO SURGEON STRL SZ 6.5 (GLOVE) IMPLANT
GLOVE BIOGEL PI IND STRL 7.0 (GLOVE) ×1 IMPLANT
GLOVE BIOGEL PI IND STRL 7.5 (GLOVE) ×1 IMPLANT
GLOVE BIOGEL PI INDICATOR 7.0 (GLOVE) ×1
GLOVE BIOGEL PI INDICATOR 7.5 (GLOVE) ×1
GLOVE EXAM NITRILE LRG STRL (GLOVE) ×2 IMPLANT
GLOVE SURG SS PI 6.5 STRL IVOR (GLOVE) ×2 IMPLANT
GLOVE SURG SS PI 7.5 STRL IVOR (GLOVE) ×2 IMPLANT
GOWN STRL REUS W/ TWL LRG LVL3 (GOWN DISPOSABLE) ×3 IMPLANT
GOWN STRL REUS W/TWL LRG LVL3 (GOWN DISPOSABLE) ×3
GRAFT DERMACARRIERS 1 TO 1.5 (DISPOSABLE) ×1 IMPLANT
LIQUID BAND (GAUZE/BANDAGES/DRESSINGS) IMPLANT
NEEDLE PRECISIONGLIDE 27X1.5 (NEEDLE) ×2 IMPLANT
NS IRRIG 1000ML POUR BTL (IV SOLUTION) ×4 IMPLANT
PACK BASIN DAY SURGERY FS (CUSTOM PROCEDURE TRAY) ×2 IMPLANT
PAD CAST 3X4 CTTN HI CHSV (CAST SUPPLIES) IMPLANT
PAD CAST 4YDX4 CTTN HI CHSV (CAST SUPPLIES) IMPLANT
PADDING CAST COTTON 3X4 STRL (CAST SUPPLIES)
PADDING CAST COTTON 4X4 STRL (CAST SUPPLIES)
PENCIL BUTTON HOLSTER BLD 10FT (ELECTRODE) ×2 IMPLANT
SHEET MEDIUM DRAPE 40X70 STRL (DRAPES) ×2 IMPLANT
SPONGE GAUZE 4X4 12PLY STER LF (GAUZE/BANDAGES/DRESSINGS) IMPLANT
SPONGE LAP 18X18 X RAY DECT (DISPOSABLE) ×2 IMPLANT
STAPLER VISISTAT 35W (STAPLE) ×2 IMPLANT
STOCKINETTE 4X48 STRL (DRAPES) IMPLANT
STOCKINETTE 6  STRL (DRAPES)
STOCKINETTE 6 STRL (DRAPES) IMPLANT
STOCKINETTE IMPERVIOUS LG (DRAPES) ×2 IMPLANT
STRIP CLOSURE SKIN 1/2X4 (GAUZE/BANDAGES/DRESSINGS) IMPLANT
SURGILUBE 2OZ TUBE FLIPTOP (MISCELLANEOUS) IMPLANT
SUT CHROMIC 4 0 PS 2 18 (SUTURE) ×4 IMPLANT
SUT CHROMIC 5 0 P 3 (SUTURE) IMPLANT
SUT MNCRL AB 4-0 PS2 18 (SUTURE) IMPLANT
SUT SILK 3 0 SH CR/8 (SUTURE) IMPLANT
SUT SILK 4 0 SH CR/8 (SUTURE) IMPLANT
SUT VIC AB 5-0 P-3 18X BRD (SUTURE) IMPLANT
SUT VIC AB 5-0 P3 18 (SUTURE)
SYR BULB 3OZ (MISCELLANEOUS) IMPLANT
SYR CONTROL 10ML LL (SYRINGE) IMPLANT
TOWEL OR 17X24 6PK STRL BLUE (TOWEL DISPOSABLE) ×4 IMPLANT
TRAY DSU PREP LF (CUSTOM PROCEDURE TRAY) ×2 IMPLANT
TUBE CONNECTING 20X1/4 (TUBING) ×2 IMPLANT
UNDERPAD 30X30 INCONTINENT (UNDERPADS AND DIAPERS) ×2 IMPLANT
YANKAUER SUCT BULB TIP NO VENT (SUCTIONS) ×2 IMPLANT

## 2015-02-03 NOTE — Interval H&P Note (Signed)
History and Physical Interval Note:  02/03/2015 7:06 AM  Ian [name]  has presented today for surgery, with the diagnosis of OPEN WOUND LEFT LEG  The various methods of treatment have been discussed with the patient and family. After consideration of risks, benefits and other options for treatment, the patient has consented to  Procedure(s): SKIN GRAFT FROM  LEFT THIGH TO LEFT LEG (Left) as a surgical intervention .  The patient's history has been reviewed, patient examined, no change in status, stable for surgery.  I have reviewed the patient's chart and labs.  Questions were answered to the patient's satisfaction.     Alexah Kivett

## 2015-02-03 NOTE — Anesthesia Procedure Notes (Signed)
Procedure Name: LMA Insertion Date/Time: 02/03/2015 9:16 AM Performed by: Genevieve NorlanderLINKA, Sun Wilensky L Pre-anesthesia Checklist: Patient identified, Emergency Drugs available, Suction available, Patient being monitored and Timeout performed Patient Re-evaluated:Patient Re-evaluated prior to inductionOxygen Delivery Method: Circle System Utilized Preoxygenation: Pre-oxygenation with 100% oxygen Intubation Type: IV induction Ventilation: Mask ventilation without difficulty LMA: LMA inserted LMA Size: 5.0 Number of attempts: 1 Airway Equipment and Method: Bite block Placement Confirmation: positive ETCO2 Tube secured with: Tape Dental Injury: Teeth and Oropharynx as per pre-operative assessment

## 2015-02-03 NOTE — Op Note (Signed)
Operative Note   DATE OF OPERATION: 2.26.2016  [facility]  SURGICAL DIVISION: Plastic Surgery  PREOPERATIVE DIAGNOSES:  1. Open wound left leg traumatic  POSTOPERATIVE DIAGNOSES:  same  PROCEDURE:  1. Surgical preparation for grafting 30 cm2 2. Split thickness skin grafting from left thigh to left leg 30 cm2  SURGEON: Ian FellowsBrinda Nadya Hopwood MD MBA  ASSISTANT: none  ANESTHESIA:  General.   EBL: 25 ml  COMPLICATIONS: None.   INDICATIONS FOR PROCEDURE:  The patient, Ian [name], is a 69 y.o. male born on 11-18-1946, is here for skin grafting to open wound incurred from tire falling on leg several months ago.   FINDINGS: Granulated wound lateral left leg 15 x 2 cm  DESCRIPTION OF PROCEDURE:  The patient's operative site was marked with the patient in the preoperative area. The patient was taken to the operating room. SCDs were placed and IV antibiotics were given. The patient's operative site was prepped and draped in a sterile fashion. A time out was performed and all information was confirmed to be correct. The wound with treated by curettage to remove all slough, hypergranulation and to freshen skin edges. Wound irrigated. Split thickness autograft 12/1000th inch harvested from left thigh and meshed in 1:1.5 ratio. Graft inset with 4-0 chromic and dressed with adaptic and NPWT sponge. NPWT set to 120 mmHg continuous as bolster over grafted area. Donor site dressed with Xeroform, dry dressing and Ace wrap.  The patient was allowed to wake from anesthesia, extubated and taken to the recovery room in satisfactory condition.   SPECIMENS: none  DRAINS: none  Ian FellowsBrinda Luwanda Starr, MD [facility] Plastic & Reconstructive Surgery [phone]

## 2015-02-03 NOTE — Discharge Instructions (Signed)

## 2015-02-03 NOTE — Anesthesia Preprocedure Evaluation (Signed)
Anesthesia Evaluation  Patient identified by MRN, date of birth, ID band Patient awake    Reviewed: Allergy & Precautions, NPO status , Patient's Chart, lab work & pertinent test results  Airway Mallampati: II   Neck ROM: full    Dental   Pulmonary asthma , sleep apnea , COPD         Cardiovascular hypertension, + CAD, + Past MI, + CABG and + Peripheral Vascular Disease + dysrhythmias Atrial Fibrillation + pacemaker     Neuro/Psych    GI/Hepatic GERD-  ,  Endo/Other  diabetes  Renal/GU      Musculoskeletal   Abdominal   Peds  Hematology   Anesthesia Other Findings   Reproductive/Obstetrics                             Anesthesia Physical Anesthesia Plan  ASA: III  Anesthesia Plan: General   Post-op Pain Management:    Induction: Intravenous  Airway Management Planned: LMA  Additional Equipment:   Intra-op Plan:   Post-operative Plan:   Informed Consent: I have reviewed the patients History and Physical, chart, labs and discussed the procedure including the risks, benefits and alternatives for the proposed anesthesia with the patient or authorized representative who has indicated his/her understanding and acceptance.     Plan Discussed with: CRNA, Anesthesiologist and Surgeon  Anesthesia Plan Comments:         Anesthesia Quick Evaluation

## 2015-02-03 NOTE — Anesthesia Postprocedure Evaluation (Signed)
Anesthesia Post Note  Patient: Ian [name]  Procedure(s) Performed: Procedure(s) (LRB): SKIN GRAFT FROM  LEFT THIGH TO LEFT LEG (Left)  Anesthesia type: General  Patient location: PACU  Post pain: Pain level controlled and Adequate analgesia  Post assessment: Post-op Vital signs reviewed, Patient's Cardiovascular Status Stable, Respiratory Function Stable, Patent Airway and Pain level controlled  Last Vitals:  Filed Vitals:   02/03/15 1115  BP: 138/95  Pulse: 76  Temp: 36.4 C  Resp: 18    Post vital signs: Reviewed and stable  Level of consciousness: awake, alert  and oriented  Complications: No apparent anesthesia complications

## 2015-02-03 NOTE — Transfer of Care (Signed)
Immediate Anesthesia Transfer of Care Note  Patient: Ian [name]  Procedure(s) Performed: Procedure(s): SKIN GRAFT FROM  LEFT THIGH TO LEFT LEG (Left)  Patient Location: PACU  Anesthesia Type:General  Level of Consciousness: sedated and patient cooperative  Airway & Oxygen Therapy: Patient Spontanous Breathing and Patient connected to face mask oxygen  Post-op Assessment: Report given to RN and Post -op Vital signs reviewed and stable  Post vital signs: Reviewed and stable  Last Vitals:  Filed Vitals:   02/03/15 0755  BP: 131/78  Pulse: 77  Temp: 36.7 C  Resp: 20    Complications: No apparent anesthesia complications

## 2015-02-06 ENCOUNTER — Encounter (HOSPITAL_BASED_OUTPATIENT_CLINIC_OR_DEPARTMENT_OTHER): Payer: Self-pay | Admitting: Plastic Surgery

## 2015-02-06 NOTE — Addendum Note (Signed)
Addendum  created 02/06/15 1257 by Lance CoonWesley Maryanna Stuber, CRNA   Modules edited: Charges VN

## 2015-04-01 NOTE — Op Note (Signed)
PATIENT NAME:  [name], Ian A MR#:  [id number] DATE OF BIRTH:  [birth year]  DATE OF PROCEDURE:  11/01/2014  ATTENDING PHYSICIAN:  Cristal Deerhristopher A. Ammon Muscatello, MD  PREOPERATIVE DIAGNOSIS:  Left leg wound with necrosis.   POSTOPERATIVE DIAGNOSIS: Left leg wound with necrosis.   PROCEDURE PERFORMED: Debridement of necrotic tissue from left leg wound. Total dimensions, 22 x 8 x 2 cm.   ANESTHESIA: General.   ESTIMATED BLOOD LOSS: 10 mL.   COMPLICATIONS: None.   SPECIMENS: None.   INDICATION FOR SURGERY: [name] is a pleasant 69 year old who had a hematoma on his left leg which developed a skin necrosis and was seen in [facility]. He was admitted for debridement of wound.   DETAILS OF PROCEDURE AS FOLLOWS: Informed consent was obtained.  [name] was brought to the operating room suite. He was induced. Endotracheal tube was placed. General anesthesia was administered. His leg was prepped and draped in standard surgical fashion. A timeout was then performed correctly identifying the patient name, operative site, and procedure to be performed. I then proceeded to debride all necrotic tissue. The total amount of debrided tissue was 22 cm x 8 cm x 2 cm. There was 2 cm overhangs from both sides of possibly viable tissue that I left in place.  It was sharply and through electrocautery debrided down to muscle fascia, mostly fat and subcutaneous tissue. A Betadine-soaked gauze was then placed in the wound. The wound was then wrapped with a Kerlix. The patient was then awoken, extubated, and brought to the postanesthesia care unit. There were no immediate complications. Needle, sponge, and instrument counts were correct at the end of the procedure.    ____________________________ Si Raiderhristopher A. Takeysha Bonk, MD cal:LT D: 11/02/2014 13:46:00 ET T: 11/02/2014 16:52:03 ET JOB#: [id number]  cc: Cristal Deerhristopher A. Merritt Mccravy, MD, <Dictator> Jarvis NewcomerHRISTOPHER A Bayyinah Dukeman MD ELECTRONICALLY SIGNED 11/14/2014 20:07

## 2015-04-01 NOTE — Op Note (Signed)
PATIENT NAME:  Ian Ian [name], Ian Ian [name] MR#:  [id number] DATE OF BIRTH:  [birth year]  DATE OF PROCEDURE:  01/05/2014  DATE OF DICTATION: 01/05/2014   PREOPERATIVE DIAGNOSES:  1. High-grade left carotid artery stenosis.  2. Parkinson's.  3. Hypertension.  4. Atrial fibrillation.  5. Diabetes.   POSTOPERATIVE DIAGNOSES:  1. High-grade left carotid artery stenosis.  2. Parkinson's.  3. Hypertension.  4. Atrial fibrillation.  5. Diabetes.   PROCEDURES PERFORMED: Left carotid endarterectomy with CorMatrix arterial patch reconstruction.   SURGEON: Annice NeedyJason S. Dew, MD  ANESTHESIA: General.   BLOOD LOSS: Approximately 100 mL.   INDICATION FOR PROCEDURE: Ian [name] 69 year old white male who we have followed for carotid artery disease. His left carotid artery stenosis has now progressed to 75% or greater, and he is offered carotid endarterectomy for stroke risk reduction. Risks and benefits were discussed, and he desired to proceed.   DESCRIPTION OF THE PROCEDURE: The patient was brought to the operative suite, and after an adequate level of general anesthesia was obtained, the patient was placed in modified beach chair position. Ian [name] roll was placed under his shoulders. His neck was flexed. His neck was then sterilely prepped and draped and Ian [name] sterile surgical field was created. An incision was created along the anterior border of the sternocleidomastoid. I dissected down through the platysma with electrocautery. Several venous branches were ligated and divided between silk ties to expose the carotid bifurcation. As was suspected from his preoperative CT scan, there were lots of venous collaterals in the neck and not Ian [name] true facial vein. The carotid bifurcation was identified and encircled with vessel loops in the common carotid artery, external carotid artery and internal carotid artery distal to the lesion. The superior thyroid artery was ligated. The patient was systemically heparinized with 7000 units of intravenous  heparin, and this was allowed to circulate for 5 minutes. Control was then pulled up on the vessel loops, and anterior wall arteriotomy was created with an 11-blade and extended with Potts scissors. The Pruitt-Inahara shunt was then placed first in the internal carotid artery, flushed and de-aired, then in the common carotid artery, flushed and de-aired, and then flow was restored. Time from clamping to restoration of flow was 1 minute. An endarterectomy was then performed in the usual fashion. The proximal endpoint was cut flush with tenotomy scissors. An eversion endarterectomy was performed on the external carotid artery. Ian [name] nice feathered distal endpoint was created with gentle traction on the internal carotid artery. Distal endpoint was tacked down with three 7-0 Prolene sutures. All loose flecks were removed, and the vessel was locally heparinized. Ian [name] CorMatrix patch was then brought in the field. It was cut and beveled distally, and Ian [name] 6-0 Prolene was started at the distal endpoint with the CorMatrix patch. The suture line was run one-half the length of the arteriotomy medially and laterally. The patch was then cut and beveled to an appropriate length to match the arteriotomy, and Ian [name] second 6-0 Prolene was started at the proximal endpoint. The medial suture line was run and tied together. The lateral suture line was run approximately one-quarter the length of the arteriotomy, and at this point, the shunt was removed. The vessel was flushed from the internal, external and common carotid arteries and locally heparinized. The suture line was then closed, flushing through the external carotid artery prior to completing the suture line and allowing several cardiac cycles to traverse up the external carotid artery prior to release of control on the internal carotid artery.  Time from shunt removal to restoration of flow was approximately 3 minutes. Three 6-0 Prolene patch sutures were then used for hemostasis, and  hemostasis was achieved. The wound was irrigated. Surgicel and Evicel topical hemostatic agents were placed. The wound was then closed with 3 interrupted 3-0 Vicryl sutures in the sternocleidomastoid space, the platysma was closed with Ian [name] running 3-0 Vicryl, and the skin was closed with 4-0 Monocryl. Dermabond was placed as Ian [name] dressing. The patient was then awakened from anesthesia, following commands, and was taken to the recovery room in stable condition without obvious focal deficits.   ____________________________ Annice Needy, MD jsd:lb D: 01/05/2014 10:17:56 ET T: 01/05/2014 10:39:35 ET JOB#: [id number]  cc: Annice Needy, MD, <Dictator> Marya Amsler. Dareen Piano, MD Annice Needy MD ELECTRONICALLY SIGNED 01/11/2014 11:05

## 2015-04-13 DIAGNOSIS — G20 Parkinson's disease: Secondary | ICD-10-CM | POA: Insufficient documentation

## 2015-05-10 DIAGNOSIS — L97921 Non-pressure chronic ulcer of unspecified part of left lower leg limited to breakdown of skin: Secondary | ICD-10-CM | POA: Insufficient documentation

## 2015-08-30 DIAGNOSIS — Z0000 Encounter for general adult medical examination without abnormal findings: Secondary | ICD-10-CM | POA: Insufficient documentation

## 2015-10-30 ENCOUNTER — Other Ambulatory Visit: Payer: Self-pay | Admitting: Vascular Surgery

## 2015-11-06 ENCOUNTER — Ambulatory Visit
Admission: RE | Admit: 2015-11-06 | Discharge: 2015-11-06 | Disposition: A | Discharge status: Home / Self Care | Payer: Medicare PPO | Source: Ambulatory Visit | Attending: Vascular Surgery | Admitting: Vascular Surgery

## 2015-11-06 ENCOUNTER — Encounter: Payer: Self-pay | Admitting: *Deleted

## 2015-11-06 ENCOUNTER — Encounter
Admission: RE | Disposition: A | Discharge status: Home / Self Care | Payer: Self-pay | Source: Ambulatory Visit | Attending: Vascular Surgery

## 2015-11-06 DIAGNOSIS — E7800 Pure hypercholesterolemia, unspecified: Secondary | ICD-10-CM | POA: Insufficient documentation

## 2015-11-06 DIAGNOSIS — Z8673 Personal history of transient ischemic attack (TIA), and cerebral infarction without residual deficits: Secondary | ICD-10-CM | POA: Diagnosis not present

## 2015-11-06 DIAGNOSIS — Z86718 Personal history of other venous thrombosis and embolism: Secondary | ICD-10-CM | POA: Diagnosis present

## 2015-11-06 DIAGNOSIS — E119 Type 2 diabetes mellitus without complications: Secondary | ICD-10-CM | POA: Insufficient documentation

## 2015-11-06 DIAGNOSIS — Z79899 Other long term (current) drug therapy: Secondary | ICD-10-CM | POA: Insufficient documentation

## 2015-11-06 DIAGNOSIS — I2510 Atherosclerotic heart disease of native coronary artery without angina pectoris: Secondary | ICD-10-CM | POA: Insufficient documentation

## 2015-11-06 DIAGNOSIS — Z7901 Long term (current) use of anticoagulants: Secondary | ICD-10-CM | POA: Insufficient documentation

## 2015-11-06 DIAGNOSIS — H5340 Unspecified visual field defects: Secondary | ICD-10-CM | POA: Diagnosis not present

## 2015-11-06 DIAGNOSIS — Z794 Long term (current) use of insulin: Secondary | ICD-10-CM | POA: Insufficient documentation

## 2015-11-06 DIAGNOSIS — I6529 Occlusion and stenosis of unspecified carotid artery: Secondary | ICD-10-CM | POA: Insufficient documentation

## 2015-11-06 DIAGNOSIS — I252 Old myocardial infarction: Secondary | ICD-10-CM | POA: Diagnosis not present

## 2015-11-06 HISTORY — PX: PERIPHERAL VASCULAR CATHETERIZATION: SHX172C

## 2015-11-06 HISTORY — DX: Cardiac arrhythmia, unspecified: I49.9

## 2015-11-06 LAB — GLUCOSE, CAPILLARY: GLUCOSE-CAPILLARY: 150 mg/dL — AB (ref 65–99)

## 2015-11-06 SURGERY — IVC FILTER INSERTION
Anesthesia: Moderate Sedation | Wound class: Clean

## 2015-11-06 MED ORDER — MIDAZOLAM HCL 2 MG/2ML IJ SOLN
INTRAMUSCULAR | Status: DC | PRN
  Administered 2015-11-06: 2 mg via INTRAVENOUS

## 2015-11-06 MED ORDER — HEPARIN (PORCINE) IN NACL 2-0.9 UNIT/ML-% IJ SOLN
INTRAMUSCULAR | Status: AC
  Filled 2015-11-06: qty 500

## 2015-11-06 MED ORDER — FENTANYL CITRATE (PF) 100 MCG/2ML IJ SOLN
INTRAMUSCULAR | Status: AC
  Filled 2015-11-06: qty 2

## 2015-11-06 MED ORDER — LIDOCAINE-EPINEPHRINE (PF) 1 %-1:200000 IJ SOLN
INTRAMUSCULAR | Status: AC
  Filled 2015-11-06: qty 30

## 2015-11-06 MED ORDER — ONDANSETRON HCL 4 MG/2ML IJ SOLN: 4.0000 mg | Freq: Four times a day (QID) | INTRAMUSCULAR | Status: DC | PRN

## 2015-11-06 MED ORDER — GUAIFENESIN-DM 100-10 MG/5ML PO SYRP: 15.0000 mL | ORAL_SOLUTION | ORAL | Status: DC | PRN

## 2015-11-06 MED ORDER — PHENOL 1.4 % MT LIQD: 1.0000 | OROMUCOSAL | Status: DC | PRN

## 2015-11-06 MED ORDER — HYDROMORPHONE HCL 1 MG/ML IJ SOLN: 0.5000 mg | INTRAMUSCULAR | Status: DC | PRN

## 2015-11-06 MED ORDER — INSULIN ASPART 100 UNIT/ML ~~LOC~~ SOLN: 0.0000 [IU] | SUBCUTANEOUS | Status: DC

## 2015-11-06 MED ORDER — HYDRALAZINE HCL 20 MG/ML IJ SOLN: 5.0000 mg | INTRAMUSCULAR | Status: DC | PRN

## 2015-11-06 MED ORDER — METOPROLOL TARTRATE 1 MG/ML IV SOLN: 2.0000 mg | INTRAVENOUS | Status: DC | PRN

## 2015-11-06 MED ORDER — ACETAMINOPHEN 325 MG RE SUPP: 325.0000 mg | RECTAL | Status: DC | PRN

## 2015-11-06 MED ORDER — IOHEXOL 300 MG/ML  SOLN
INTRAMUSCULAR | Status: DC | PRN
  Administered 2015-11-06: 15 mL via INTRA_ARTERIAL

## 2015-11-06 MED ORDER — ACETAMINOPHEN 325 MG PO TABS: 325.0000 mg | ORAL_TABLET | ORAL | Status: DC | PRN

## 2015-11-06 MED ORDER — MIDAZOLAM HCL 2 MG/2ML IJ SOLN
INTRAMUSCULAR | Status: AC
  Filled 2015-11-06: qty 2

## 2015-11-06 MED ORDER — DEXTROSE 5 % IV SOLN
1.5000 g | INTRAVENOUS | Status: AC
  Administered 2015-11-06: 1.5 g via INTRAVENOUS
  Filled 2015-11-06: qty 1.5

## 2015-11-06 MED ORDER — SODIUM CHLORIDE 0.9 % IV SOLN
INTRAVENOUS | Status: DC
  Administered 2015-11-06: 08:00:00 via INTRAVENOUS

## 2015-11-06 MED ORDER — OXYCODONE-ACETAMINOPHEN 5-325 MG PO TABS: 1.0000 | ORAL_TABLET | ORAL | Status: DC | PRN

## 2015-11-06 MED ORDER — FENTANYL CITRATE (PF) 100 MCG/2ML IJ SOLN
INTRAMUSCULAR | Status: DC | PRN
  Administered 2015-11-06: 100 ug via INTRAVENOUS

## 2015-11-06 MED ORDER — LABETALOL HCL 5 MG/ML IV SOLN: 10.0000 mg | INTRAVENOUS | Status: DC | PRN

## 2015-11-06 SURGICAL SUPPLY — 4 items
FILTER VC CELECT-FEMORAL (Filter) ×2 IMPLANT
PACK ANGIOGRAPHY (CUSTOM PROCEDURE TRAY) ×2 IMPLANT
TOWEL OR 17X26 4PK STRL BLUE (TOWEL DISPOSABLE) ×2 IMPLANT
WIRE J 3MM .035X145CM (WIRE) ×2 IMPLANT

## 2015-11-06 NOTE — Op Note (Signed)
[facility]   OPERATIVE NOTE    PRE-OPERATIVE DIAGNOSIS: history of DVT, upcoming major joint surgery  POST-OPERATIVE DIAGNOSIS: same as above  PROCEDURE: 1.   Ultrasound guidance for vascular access to the right femoral vein 2.   Catheter placement into the inferior vena cava 3.   Inferior venacavogram 4.   Placement of a Cook Celect IVC filter  SURGEON: Festus BarrenJason Dew, MD  ASSISTANT(S): None  ANESTHESIA: local/sedation  ESTIMATED BLOOD LOSS: minimal  FINDING(S): 1.  Patent IVC  SPECIMEN(S):  none  INDICATIONS:   Ian BumpersCharles A Walrond is a 69 y.o. male who presents with history of DVT and upcoming major joint surgery and his Orthopedic surgery recommended he have a filter before his surgery and I agree with this assessment.  Inferior vena cava filter is indicated for this reason.  Risks and benefits including filter thrombosis, migration, fracture, bleeding, and infection were all discussed.  We discussed that all IVC filters that we place can be removed if desired from the patient once the need for the filter has passed.    DESCRIPTION: After obtaining full informed written consent, the patient was brought back to the vascular suite. The skin was sterilely prepped and draped in a sterile surgical field was created. The right femoral vein was accessed under direct ultrasound guidance without difficulty with a Seldinger needle and a J-wire was then placed. After skin nick and dilatation, the delivery sheath was placed into the inferior vena cava and an inferior venacavogram was performed. This demonstrated a patent IVC with the level of the renal veins at the top of L1.  The filter was then deployed into the inferior vena cava at the bottom of L1 just below the renal veins. The delivery sheath was then removed. Pressure was held. Sterile dressings were placed. The patient tolerated the procedure well and was taken to the recovery room in stable  condition.  COMPLICATIONS: None  CONDITION: Stable  DEW,JASON  11/06/2015, 8:28 AM

## 2015-11-06 NOTE — H&P (Signed)
  [facility] History & Physical Update  The patient was interviewed and re-examined.  The patient's previous History and Physical has been reviewed and is unchanged.  There is no change in the plan of care. We plan to proceed with the scheduled procedure.  DEW,JASON, MD  11/06/2015, 8:07 AM

## 2015-11-06 NOTE — OR Nursing (Signed)
CBG: 150 

## 2015-11-07 ENCOUNTER — Encounter: Payer: Self-pay | Admitting: Vascular Surgery

## 2015-11-22 ENCOUNTER — Inpatient Hospital Stay: Admission: RE | Admit: 2015-11-22 | Payer: Medicare PPO | Source: Ambulatory Visit

## 2015-11-24 ENCOUNTER — Encounter
Admission: RE | Admit: 2015-11-24 | Discharge: 2015-11-24 | Disposition: A | Discharge status: Home / Self Care | Payer: Medicare PPO | Source: Ambulatory Visit | Attending: Orthopedic Surgery | Admitting: Orthopedic Surgery

## 2015-11-24 DIAGNOSIS — Z01812 Encounter for preprocedural laboratory examination: Secondary | ICD-10-CM | POA: Diagnosis present

## 2015-11-24 HISTORY — DX: Cellulitis, unspecified: L03.90

## 2015-11-24 HISTORY — DX: Other seasonal allergic rhinitis: J30.2

## 2015-11-24 HISTORY — DX: Unspecified injury of left lower leg, initial encounter: S89.92XA

## 2015-11-24 LAB — URINALYSIS COMPLETE WITH MICROSCOPIC (ARMC ONLY)
BILIRUBIN URINE: NEGATIVE
Bacteria, UA: NONE SEEN
Glucose, UA: NEGATIVE mg/dL
HGB URINE DIPSTICK: NEGATIVE
LEUKOCYTES UA: NEGATIVE
NITRITE: NEGATIVE
PH: 5 (ref 5.0–8.0)
Protein, ur: NEGATIVE mg/dL
SPECIFIC GRAVITY, URINE: 1.024 (ref 1.005–1.030)

## 2015-11-24 LAB — CBC
HEMATOCRIT: 40.5 % (ref 40.0–52.0)
HEMOGLOBIN: 13.3 g/dL (ref 13.0–18.0)
MCH: 29.2 pg (ref 26.0–34.0)
MCHC: 32.8 g/dL (ref 32.0–36.0)
MCV: 89 fL (ref 80.0–100.0)
Platelets: 172 10*3/uL (ref 150–440)
RBC: 4.55 MIL/uL (ref 4.40–5.90)
RDW: 15.4 % — AB (ref 11.5–14.5)
WBC: 8.5 10*3/uL (ref 3.8–10.6)

## 2015-11-24 LAB — BASIC METABOLIC PANEL
ANION GAP: 8 (ref 5–15)
BUN: 26 mg/dL — ABNORMAL HIGH (ref 6–20)
CALCIUM: 9.5 mg/dL (ref 8.9–10.3)
CO2: 26 mmol/L (ref 22–32)
Chloride: 104 mmol/L (ref 101–111)
Creatinine, Ser: 0.96 mg/dL (ref 0.61–1.24)
GFR calc non Af Amer: >60 mL/min (ref >60)
Glucose, Bld: 138 mg/dL — ABNORMAL HIGH (ref 65–99)
POTASSIUM: 5 mmol/L (ref 3.5–5.1)
Sodium: 138 mmol/L (ref 135–145)

## 2015-11-24 LAB — SURGICAL PCR SCREEN
MRSA, PCR: NEGATIVE
Staphylococcus aureus: NEGATIVE

## 2015-11-24 LAB — TYPE AND SCREEN
ABO/RH(D): O POS
Antibody Screen: NEGATIVE

## 2015-11-24 LAB — PROTIME-INR
INR: 1.06
Prothrombin Time: 14 seconds (ref 11.4–15.0)

## 2015-11-24 LAB — SEDIMENTATION RATE: SED RATE: 29 mm/h — AB (ref 0–20)

## 2015-11-24 LAB — ABO/RH: ABO/RH(D): O POS

## 2015-11-24 LAB — APTT: APTT: 27 s (ref 24–36)

## 2015-11-24 NOTE — Patient Instructions (Signed)
  Your procedure is scheduled on: 12/05/15 Tues Report to Day Surgery.2nd floor medical mall To find out your arrival time please call [phone] between 1PM - 3PM on 12/03/15 Mon  Remember: Instructions that are not followed completely may result in serious medical risk, up to and including death, or upon the discretion of your surgeon and anesthesiologist your surgery may need to be rescheduled.    __x__ 1. Do not eat food or drink liquids after midnight. No gum chewing or hard candies.     ____ 2. No Alcohol for 24 hours before or after surgery.   ____ 3. Bring all medications with you on the day of surgery if instructed.    ___x_ 4. Notify your doctor if there is any change in your medical condition     (cold, fever, infections).     Do not wear jewelry, make-up, hairpins, clips or nail polish.  Do not wear lotions, powders, or perfumes. You may wear deodorant.  Do not shave 48 hours prior to surgery. Men may shave face and neck.  Do not bring valuables to the hospital.    [facility] is not responsible for any belongings or valuables.               Contacts, dentures or bridgework may not be worn into surgery.  Leave your suitcase in the car. After surgery it may be brought to your room.  For patients admitted to the hospital, discharge time is determined by your                treatment team.   Patients discharged the day of surgery will not be allowed to drive home.   Please read over the following fact sheets that you were given:   MRSA Information   __x__ Take these medicines the morning of surgery with A SIP OF WATER:    1.carbidopa-levodopa (SINEMET IR) 25-100 MG per tablet  2. esomeprazole (NEXIUM) 20 MG capsule  3. metoprolol tartrate (LOPRESSOR) 25 MG tablet  4.  5.  6.  ____ Fleet Enema (as directed)   __x_ Use CHG Soap as directed  ____ Use inhalers on the day of surgery  ____ Stop metformin 2 days prior to surgery    ____ Take 1/2 of usual insulin  dose the night before surgery and none on the morning of surgery.   _x___ Stop Coumadin/Plavix/aspirin on Check with Dr Lady GaryFath about stopping Aspirin  _x___ Stop Anti-inflammatories on ibuprofen (ADVIL,MOTRIN) 200 MG tablet stop on 20th of Dec.  May take Tylenol if needed.   ____ Stop supplements until after surgery.    ____ Bring C-Pap to the hospital.

## 2015-11-26 LAB — URINE CULTURE: Culture: NO GROWTH

## 2015-12-05 ENCOUNTER — Inpatient Hospital Stay: Payer: Medicare PPO

## 2015-12-05 ENCOUNTER — Encounter: Payer: Self-pay | Admitting: *Deleted

## 2015-12-05 ENCOUNTER — Inpatient Hospital Stay: Payer: Medicare PPO | Admitting: Certified Registered"

## 2015-12-05 ENCOUNTER — Encounter
Admission: AD | Disposition: A | Discharge status: Home / Self Care | Payer: Self-pay | Source: Ambulatory Visit | Attending: Orthopedic Surgery

## 2015-12-05 ENCOUNTER — Inpatient Hospital Stay
Admission: AD | Admit: 2015-12-05 | Discharge: 2015-12-07 | DRG: 470 | Disposition: A | Discharge status: Home / Self Care | Payer: Medicare PPO | Source: Ambulatory Visit | Attending: Orthopedic Surgery | Admitting: Orthopedic Surgery

## 2015-12-05 DIAGNOSIS — I4892 Unspecified atrial flutter: Secondary | ICD-10-CM | POA: Diagnosis present

## 2015-12-05 DIAGNOSIS — I824Z9 Acute embolism and thrombosis of unspecified deep veins of unspecified distal lower extremity: Secondary | ICD-10-CM | POA: Diagnosis present

## 2015-12-05 DIAGNOSIS — G20 Parkinson's disease: Secondary | ICD-10-CM | POA: Diagnosis present

## 2015-12-05 DIAGNOSIS — Z794 Long term (current) use of insulin: Secondary | ICD-10-CM | POA: Diagnosis not present

## 2015-12-05 DIAGNOSIS — M1611 Unilateral primary osteoarthritis, right hip: Secondary | ICD-10-CM | POA: Diagnosis present

## 2015-12-05 DIAGNOSIS — Z86718 Personal history of other venous thrombosis and embolism: Secondary | ICD-10-CM | POA: Diagnosis not present

## 2015-12-05 DIAGNOSIS — I2510 Atherosclerotic heart disease of native coronary artery without angina pectoris: Secondary | ICD-10-CM | POA: Diagnosis present

## 2015-12-05 DIAGNOSIS — G8918 Other acute postprocedural pain: Secondary | ICD-10-CM

## 2015-12-05 DIAGNOSIS — Z950 Presence of cardiac pacemaker: Secondary | ICD-10-CM

## 2015-12-05 DIAGNOSIS — K219 Gastro-esophageal reflux disease without esophagitis: Secondary | ICD-10-CM | POA: Diagnosis present

## 2015-12-05 DIAGNOSIS — I10 Essential (primary) hypertension: Secondary | ICD-10-CM | POA: Diagnosis present

## 2015-12-05 DIAGNOSIS — E119 Type 2 diabetes mellitus without complications: Secondary | ICD-10-CM | POA: Diagnosis present

## 2015-12-05 DIAGNOSIS — I4891 Unspecified atrial fibrillation: Secondary | ICD-10-CM | POA: Diagnosis present

## 2015-12-05 DIAGNOSIS — I252 Old myocardial infarction: Secondary | ICD-10-CM

## 2015-12-05 DIAGNOSIS — I481 Persistent atrial fibrillation: Secondary | ICD-10-CM | POA: Diagnosis present

## 2015-12-05 DIAGNOSIS — Z419 Encounter for procedure for purposes other than remedying health state, unspecified: Secondary | ICD-10-CM

## 2015-12-05 DIAGNOSIS — J45909 Unspecified asthma, uncomplicated: Secondary | ICD-10-CM | POA: Diagnosis present

## 2015-12-05 HISTORY — PX: TOTAL HIP ARTHROPLASTY: SHX124

## 2015-12-05 LAB — CREATININE, SERUM: CREATININE: 0.83 mg/dL (ref 0.61–1.24)

## 2015-12-05 LAB — CBC
HEMATOCRIT: 33.1 % — AB (ref 40.0–52.0)
HEMOGLOBIN: 10.9 g/dL — AB (ref 13.0–18.0)
MCH: 29.1 pg (ref 26.0–34.0)
MCHC: 32.9 g/dL (ref 32.0–36.0)
MCV: 88.4 fL (ref 80.0–100.0)
Platelets: 141 10*3/uL — ABNORMAL LOW (ref 150–440)
RBC: 3.74 MIL/uL — AB (ref 4.40–5.90)
RDW: 14.7 % — ABNORMAL HIGH (ref 11.5–14.5)
WBC: 8.2 10*3/uL (ref 3.8–10.6)

## 2015-12-05 LAB — GLUCOSE, CAPILLARY
GLUCOSE-CAPILLARY: 126 mg/dL — AB (ref 65–99)
GLUCOSE-CAPILLARY: 95 mg/dL (ref 65–99)
GLUCOSE-CAPILLARY: 146 mg/dL — AB (ref 65–99)
Glucose-Capillary: 101 mg/dL — ABNORMAL HIGH (ref 65–99)
Glucose-Capillary: 147 mg/dL — ABNORMAL HIGH (ref 65–99)

## 2015-12-05 SURGERY — ARTHROPLASTY, HIP, TOTAL, ANTERIOR APPROACH
Anesthesia: Spinal | Site: Hip | Laterality: Right | Wound class: Clean

## 2015-12-05 MED ORDER — MAGNESIUM HYDROXIDE 400 MG/5ML PO SUSP
30.0000 mL | Freq: Every day | ORAL | Status: DC | PRN
  Administered 2015-12-06: 30 mL via ORAL
  Filled 2015-12-05 (×2): qty 30

## 2015-12-05 MED ORDER — DOCUSATE SODIUM 100 MG PO CAPS
100.0000 mg | ORAL_CAPSULE | Freq: Two times a day (BID) | ORAL | Status: DC
  Administered 2015-12-05 – 2015-12-07 (×5): 100 mg via ORAL
  Filled 2015-12-05 (×5): qty 1

## 2015-12-05 MED ORDER — METOCLOPRAMIDE HCL 5 MG PO TABS: 5.0000 mg | ORAL_TABLET | Freq: Three times a day (TID) | ORAL | Status: DC | PRN

## 2015-12-05 MED ORDER — SPIRONOLACTONE 25 MG PO TABS
25.0000 mg | ORAL_TABLET | Freq: Every day | ORAL | Status: DC
  Administered 2015-12-05 – 2015-12-07 (×3): 25 mg via ORAL
  Filled 2015-12-05 (×3): qty 1

## 2015-12-05 MED ORDER — CARBIDOPA-LEVODOPA ER 50-200 MG PO TBCR
1.0000 | EXTENDED_RELEASE_TABLET | Freq: Every day | ORAL | Status: DC
Start: 2015-12-05 — End: 2015-12-07
  Administered 2015-12-05 – 2015-12-06 (×2): 1 via ORAL
  Filled 2015-12-05 (×4): qty 1

## 2015-12-05 MED ORDER — MORPHINE SULFATE (PF) 2 MG/ML IV SOLN: 2.0000 mg | INTRAVENOUS | Status: DC | PRN

## 2015-12-05 MED ORDER — OXYCODONE HCL 5 MG PO TABS
5.0000 mg | ORAL_TABLET | ORAL | Status: DC | PRN
  Administered 2015-12-05: 5 mg via ORAL
  Administered 2015-12-05: 10 mg via ORAL
  Administered 2015-12-06 (×2): 5 mg via ORAL
  Filled 2015-12-05: qty 2
  Filled 2015-12-05 (×2): qty 1
  Filled 2015-12-05: qty 2
  Filled 2015-12-05: qty 1
  Filled 2015-12-05: qty 2

## 2015-12-05 MED ORDER — ONDANSETRON HCL 4 MG/2ML IJ SOLN: 4.0000 mg | Freq: Four times a day (QID) | INTRAMUSCULAR | Status: DC | PRN

## 2015-12-05 MED ORDER — PANTOPRAZOLE SODIUM 40 MG PO TBEC
80.0000 mg | DELAYED_RELEASE_TABLET | Freq: Every day | ORAL | Status: DC
  Administered 2015-12-06: 80 mg via ORAL
  Filled 2015-12-05: qty 2

## 2015-12-05 MED ORDER — MENTHOL 3 MG MT LOZG
1.0000 | LOZENGE | OROMUCOSAL | Status: DC | PRN
  Filled 2015-12-05: qty 9

## 2015-12-05 MED ORDER — FENTANYL CITRATE (PF) 100 MCG/2ML IJ SOLN: 25.0000 ug | INTRAMUSCULAR | Status: DC | PRN

## 2015-12-05 MED ORDER — FENTANYL CITRATE (PF) 100 MCG/2ML IJ SOLN
INTRAMUSCULAR | Status: DC | PRN
  Administered 2015-12-05: 50 ug via INTRAVENOUS

## 2015-12-05 MED ORDER — BISACODYL 10 MG RE SUPP
10.0000 mg | Freq: Every day | RECTAL | Status: DC | PRN
  Administered 2015-12-07: 10 mg via RECTAL
  Filled 2015-12-05: qty 1

## 2015-12-05 MED ORDER — ZOLPIDEM TARTRATE 5 MG PO TABS: 5.0000 mg | ORAL_TABLET | Freq: Every evening | ORAL | Status: DC | PRN

## 2015-12-05 MED ORDER — INSULIN DETEMIR 100 UNIT/ML ~~LOC~~ SOLN
24.0000 [IU] | Freq: Every day | SUBCUTANEOUS | Status: DC
  Administered 2015-12-06 – 2015-12-07 (×2): 24 [IU] via SUBCUTANEOUS
  Filled 2015-12-05 (×3): qty 0.24

## 2015-12-05 MED ORDER — BUDESONIDE-FORMOTEROL FUMARATE 160-4.5 MCG/ACT IN AERO
2.0000 | INHALATION_SPRAY | Freq: Two times a day (BID) | RESPIRATORY_TRACT | Status: DC | PRN
  Filled 2015-12-05: qty 6

## 2015-12-05 MED ORDER — CARBIDOPA-LEVODOPA 25-100 MG PO TABS
2.0000 | ORAL_TABLET | Freq: Three times a day (TID) | ORAL | Status: DC
  Administered 2015-12-05: 2 via ORAL
  Filled 2015-12-05: qty 2

## 2015-12-05 MED ORDER — CARBIDOPA-LEVODOPA 25-100 MG PO TABS
2.0000 | ORAL_TABLET | Freq: Three times a day (TID) | ORAL | Status: DC
  Administered 2015-12-05 – 2015-12-07 (×5): 2 via ORAL
  Filled 2015-12-05 (×6): qty 2

## 2015-12-05 MED ORDER — METHOCARBAMOL 1000 MG/10ML IJ SOLN
500.0000 mg | Freq: Four times a day (QID) | INTRAVENOUS | Status: DC | PRN
  Filled 2015-12-05: qty 5

## 2015-12-05 MED ORDER — ONDANSETRON HCL 4 MG/2ML IJ SOLN: 4.0000 mg | Freq: Once | INTRAMUSCULAR | Status: DC | PRN

## 2015-12-05 MED ORDER — SODIUM CHLORIDE 0.9 % IV SOLN
INTRAVENOUS | Status: DC
  Administered 2015-12-05: 17:00:00 via INTRAVENOUS

## 2015-12-05 MED ORDER — PRAVASTATIN SODIUM 20 MG PO TABS
40.0000 mg | ORAL_TABLET | Freq: Every day | ORAL | Status: DC
  Administered 2015-12-05 – 2015-12-06 (×2): 40 mg via ORAL
  Filled 2015-12-05 (×2): qty 2

## 2015-12-05 MED ORDER — TRANEXAMIC ACID 1000 MG/10ML IV SOLN
1000.0000 mg | INTRAVENOUS | Status: DC | PRN
  Administered 2015-12-05: 1000 mg via INTRAVENOUS

## 2015-12-05 MED ORDER — BUPIVACAINE-EPINEPHRINE (PF) 0.25% -1:200000 IJ SOLN
INTRAMUSCULAR | Status: AC
  Filled 2015-12-05: qty 30

## 2015-12-05 MED ORDER — METOCLOPRAMIDE HCL 5 MG/ML IJ SOLN: 5.0000 mg | Freq: Three times a day (TID) | INTRAMUSCULAR | Status: DC | PRN

## 2015-12-05 MED ORDER — ENOXAPARIN SODIUM 40 MG/0.4ML ~~LOC~~ SOLN
40.0000 mg | SUBCUTANEOUS | Status: DC
  Administered 2015-12-06 – 2015-12-07 (×2): 40 mg via SUBCUTANEOUS
  Filled 2015-12-05 (×2): qty 0.4

## 2015-12-05 MED ORDER — LIDOCAINE HCL (CARDIAC) 20 MG/ML IV SOLN
INTRAVENOUS | Status: DC | PRN
  Administered 2015-12-05: 60 mg via INTRAVENOUS

## 2015-12-05 MED ORDER — ASPIRIN EC 81 MG PO TBEC
81.0000 mg | DELAYED_RELEASE_TABLET | Freq: Every day | ORAL | Status: DC
  Administered 2015-12-05 – 2015-12-07 (×3): 81 mg via ORAL
  Filled 2015-12-05 (×3): qty 1

## 2015-12-05 MED ORDER — PROPOFOL 500 MG/50ML IV EMUL
INTRAVENOUS | Status: DC | PRN
  Administered 2015-12-05: 70 ug/kg/min via INTRAVENOUS

## 2015-12-05 MED ORDER — SODIUM CHLORIDE 0.9 % IV SOLN
INTRAVENOUS | Status: DC
  Administered 2015-12-05 (×3): via INTRAVENOUS

## 2015-12-05 MED ORDER — PHENOL 1.4 % MT LIQD
1.0000 | OROMUCOSAL | Status: DC | PRN
  Filled 2015-12-05: qty 177

## 2015-12-05 MED ORDER — MIDAZOLAM HCL 5 MG/5ML IJ SOLN
INTRAMUSCULAR | Status: DC | PRN
  Administered 2015-12-05: 1 mg via INTRAVENOUS

## 2015-12-05 MED ORDER — PHENYLEPHRINE HCL 10 MG/ML IJ SOLN
INTRAMUSCULAR | Status: DC | PRN
Start: 2015-12-05 — End: 2015-12-05
  Administered 2015-12-05: 200 ug via INTRAVENOUS

## 2015-12-05 MED ORDER — CEFAZOLIN SODIUM-DEXTROSE 2-3 GM-% IV SOLR
INTRAVENOUS | Status: AC
  Filled 2015-12-05: qty 50

## 2015-12-05 MED ORDER — METHOCARBAMOL 500 MG PO TABS: 500.0000 mg | ORAL_TABLET | Freq: Four times a day (QID) | ORAL | Status: DC | PRN

## 2015-12-05 MED ORDER — BUPIVACAINE-EPINEPHRINE 0.25% -1:200000 IJ SOLN
INTRAMUSCULAR | Status: DC | PRN
  Administered 2015-12-05: 30 mL

## 2015-12-05 MED ORDER — INSULIN ASPART 100 UNIT/ML ~~LOC~~ SOLN
4.0000 [IU] | Freq: Three times a day (TID) | SUBCUTANEOUS | Status: DC
  Administered 2015-12-05 – 2015-12-07 (×5): 4 [IU] via SUBCUTANEOUS
  Filled 2015-12-05 (×5): qty 4

## 2015-12-05 MED ORDER — ALUM & MAG HYDROXIDE-SIMETH 200-200-20 MG/5ML PO SUSP: 30.0000 mL | ORAL | Status: DC | PRN

## 2015-12-05 MED ORDER — NEOMYCIN-POLYMYXIN B GU 40-200000 IR SOLN
Status: AC
  Filled 2015-12-05: qty 2

## 2015-12-05 MED ORDER — ACETAMINOPHEN 325 MG PO TABS: 650.0000 mg | ORAL_TABLET | Freq: Four times a day (QID) | ORAL | Status: DC | PRN

## 2015-12-05 MED ORDER — MAGNESIUM CITRATE PO SOLN
1.0000 | Freq: Once | ORAL | Status: DC | PRN
  Filled 2015-12-05: qty 296

## 2015-12-05 MED ORDER — TRANEXAMIC ACID 1000 MG/10ML IV SOLN
INTRAVENOUS | Status: AC
  Filled 2015-12-05: qty 10

## 2015-12-05 MED ORDER — BUPIVACAINE HCL (PF) 0.5 % IJ SOLN
INTRAMUSCULAR | Status: DC | PRN
Start: 2015-12-05 — End: 2015-12-05
  Administered 2015-12-05: 3.5 mL

## 2015-12-05 MED ORDER — METOPROLOL TARTRATE 25 MG PO TABS
25.0000 mg | ORAL_TABLET | Freq: Two times a day (BID) | ORAL | Status: DC
  Administered 2015-12-05 – 2015-12-07 (×4): 25 mg via ORAL
  Filled 2015-12-05 (×4): qty 1

## 2015-12-05 MED ORDER — CEFAZOLIN SODIUM-DEXTROSE 2-3 GM-% IV SOLR
2.0000 g | Freq: Four times a day (QID) | INTRAVENOUS | Status: AC
  Administered 2015-12-05 – 2015-12-06 (×3): 2 g via INTRAVENOUS
  Filled 2015-12-05 (×5): qty 50

## 2015-12-05 MED ORDER — NEOMYCIN-POLYMYXIN B GU 40-200000 IR SOLN
Status: DC | PRN
  Administered 2015-12-05: 4 mL

## 2015-12-05 MED ORDER — ONDANSETRON HCL 4 MG PO TABS: 4.0000 mg | ORAL_TABLET | Freq: Four times a day (QID) | ORAL | Status: DC | PRN

## 2015-12-05 MED ORDER — ACETAMINOPHEN 650 MG RE SUPP: 650.0000 mg | Freq: Four times a day (QID) | RECTAL | Status: DC | PRN

## 2015-12-05 MED ORDER — ADULT MULTIVITAMIN W/MINERALS CH
1.0000 | ORAL_TABLET | Freq: Every day | ORAL | Status: DC
  Administered 2015-12-05 – 2015-12-07 (×3): 1 via ORAL
  Filled 2015-12-05 (×3): qty 1

## 2015-12-05 MED ORDER — CEFAZOLIN SODIUM-DEXTROSE 2-3 GM-% IV SOLR
2.0000 g | Freq: Once | INTRAVENOUS | Status: AC
  Administered 2015-12-05: 2 g via INTRAVENOUS

## 2015-12-05 MED ORDER — INSULIN ASPART 100 UNIT/ML ~~LOC~~ SOLN
0.0000 [IU] | Freq: Three times a day (TID) | SUBCUTANEOUS | Status: DC
  Administered 2015-12-05 – 2015-12-06 (×2): 2 [IU] via SUBCUTANEOUS
  Administered 2015-12-06: 3 [IU] via SUBCUTANEOUS
  Administered 2015-12-07: 5 [IU] via SUBCUTANEOUS
  Filled 2015-12-05: qty 2
  Filled 2015-12-05: qty 5
  Filled 2015-12-05: qty 3
  Filled 2015-12-05: qty 2

## 2015-12-05 SURGICAL SUPPLY — 47 items
BLADE SAW 1/2 (BLADE) ×2 IMPLANT
BNDG COHESIVE 6X5 TAN STRL LF (GAUZE/BANDAGES/DRESSINGS) ×4 IMPLANT
CANISTER SUCT 1200ML W/VALVE (MISCELLANEOUS) ×2 IMPLANT
CAPT HIP TOTAL 3 ×2 IMPLANT
CATH FOL LEG HOLDER (MISCELLANEOUS) ×2 IMPLANT
CATH TRAY METER 16FR LF (MISCELLANEOUS) ×2 IMPLANT
CHLORAPREP W/TINT 26ML (MISCELLANEOUS) ×2 IMPLANT
DRAPE C-ARM XRAY 36X54 (DRAPES) ×2 IMPLANT
DRAPE C-SECTION (MISCELLANEOUS) ×2 IMPLANT
DRAPE INCISE IOBAN 66X60 STRL (DRAPES) IMPLANT
DRAPE POUCH INSTRU U-SHP 10X18 (DRAPES) ×2 IMPLANT
DRAPE SHEET LG 3/4 BI-LAMINATE (DRAPES) ×6 IMPLANT
DRAPE STERI IOBAN 125X83 (DRAPES) ×2 IMPLANT
DRAPE TABLE BACK 80X90 (DRAPES) ×2 IMPLANT
DRSG OPSITE POSTOP 4X8 (GAUZE/BANDAGES/DRESSINGS) ×4 IMPLANT
ELECT BLADE 6.5 EXT (BLADE) ×2 IMPLANT
GAUZE SPONGE 4X4 12PLY STRL (GAUZE/BANDAGES/DRESSINGS) ×2 IMPLANT
GLOVE BIOGEL PI IND STRL 9 (GLOVE) ×1 IMPLANT
GLOVE BIOGEL PI INDICATOR 9 (GLOVE) ×1
GLOVE SURG ORTHO 9.0 STRL STRW (GLOVE) ×2 IMPLANT
GOWN SPECIALTY ULTRA XL (MISCELLANEOUS) ×2 IMPLANT
GOWN STRL REUS W/ TWL LRG LVL3 (GOWN DISPOSABLE) ×1 IMPLANT
GOWN STRL REUS W/TWL LRG LVL3 (GOWN DISPOSABLE) ×1
HEMOVAC 400CC 10FR (MISCELLANEOUS) ×2 IMPLANT
HOOD PEEL AWAY FACE SHEILD DIS (HOOD) ×2 IMPLANT
MAT BLUE FLOOR 46X72 FLO (MISCELLANEOUS) ×2 IMPLANT
NDL SAFETY 18GX1.5 (NEEDLE) ×2 IMPLANT
NEEDLE SPNL 18GX3.5 QUINCKE PK (NEEDLE) ×2 IMPLANT
NS IRRIG 1000ML POUR BTL (IV SOLUTION) ×2 IMPLANT
PACK HIP COMPR (MISCELLANEOUS) ×2 IMPLANT
SLEEVE CABLE 2MM VT (Orthopedic Implant) ×2 IMPLANT
SOL PREP PVP 2OZ (MISCELLANEOUS) ×2
SOLUTION PREP PVP 2OZ (MISCELLANEOUS) ×1 IMPLANT
STAPLER SKIN PROX 35W (STAPLE) ×2 IMPLANT
STRAP SAFETY BODY (MISCELLANEOUS) ×2 IMPLANT
SUT DVC 2 QUILL PDO  T11 36X36 (SUTURE) ×1
SUT DVC 2 QUILL PDO T11 36X36 (SUTURE) ×1 IMPLANT
SUT DVC QUILL MONODERM 30X30 (SUTURE) ×2 IMPLANT
SUT ETHIBOND NAB CT1 #1 30IN (SUTURE) ×2 IMPLANT
SUT SILK 0 (SUTURE) ×1
SUT SILK 0 30XBRD TIE 6 (SUTURE) ×1 IMPLANT
SUT VIC AB 1 CT1 36 (SUTURE) ×2 IMPLANT
SYR 20CC LL (SYRINGE) ×2 IMPLANT
SYR 30ML LL (SYRINGE) ×2 IMPLANT
TAPE MICROFOAM 4IN (TAPE) ×2 IMPLANT
TUBE KAMVAC SUCTION (TUBING) ×2 IMPLANT
WATER STERILE IRR 1000ML POUR (IV SOLUTION) ×2 IMPLANT

## 2015-12-05 NOTE — Transfer of Care (Signed)
Immediate Anesthesia Transfer of Care Note  Patient: Ian [name]  Procedure(s) Performed: Procedure(s): TOTAL HIP ARTHROPLASTY ANTERIOR APPROACH (Right)  Patient Location: PACU  Anesthesia Type:Spinal  Level of Consciousness: awake, alert , oriented and patient cooperative  Airway & Oxygen Therapy: Patient Spontanous Breathing and Patient connected to face mask oxygen  Post-op Assessment: Report given to RN, Post -op Vital signs reviewed and stable and Patient moving all extremities X 4  Post vital signs: Reviewed and stable  Last Vitals:  Filed Vitals:   12/05/15 0807  BP: 133/88  Pulse: 73  Temp: 35.9 C  Resp: 16    Complications: No apparent anesthesia complications

## 2015-12-05 NOTE — Anesthesia Procedure Notes (Signed)
Spinal Patient location during procedure: OR Start time: 12/05/2015 9:00 AM End time: 12/05/2015 9:06 AM Reason for block: at surgeon's request Staffing Anesthesiologist: Elijio MilesVAN STAVEREN, Bradley Bostelman F Performed by: anesthesiologist  Preanesthetic Checklist Completed: patient identified, site marked, surgical consent, pre-op evaluation, timeout performed, IV checked, risks and benefits discussed, monitors and equipment checked and at surgeon's request Spinal Block Patient position: sitting Prep: Betadine Patient monitoring: heart rate and blood pressure Approach: midline Location: L3-4 Injection technique: single-shot Needle Needle type: Tuohy  Needle gauge: 25 G Needle length: 9 cm Needle insertion depth: 6 cm Assessment Sensory level: T8

## 2015-12-05 NOTE — H&P (Signed)
Reviewed paper H+P, will be scanned into chart. No changes noted.  

## 2015-12-05 NOTE — Anesthesia Postprocedure Evaluation (Signed)
Anesthesia Post Note  Patient: Era BumpersCharles A Acevedo  Procedure(s) Performed: Procedure(s) (LRB): TOTAL HIP ARTHROPLASTY ANTERIOR APPROACH (Right)  Patient location during evaluation: PACU Anesthesia Type: Spinal Level of consciousness: awake Pain management: pain level controlled Vital Signs Assessment: post-procedure vital signs reviewed and stable Respiratory status: nonlabored ventilation Cardiovascular status: stable Anesthetic complications: no    Last Vitals:  Filed Vitals:   12/05/15 0807  BP: 133/88  Pulse: 73  Temp: 35.9 C  Resp: 16    Last Pain: There were no vitals filed for this visit.               VAN STAVEREN,Blessing Ozga

## 2015-12-05 NOTE — Op Note (Signed)
12/05/2015  11:09 AM  PATIENT:  Ian [name]  69 y.o. male  PRE-OPERATIVE DIAGNOSIS:  OSTEOARTHRITIS, right hip  POST-OPERATIVE DIAGNOSIS:  OSTEOARTHRITIS, right hip  PROCEDURE:  Procedure(s): TOTAL HIP ARTHROPLASTY ANTERIOR APPROACH (Right)  SURGEON: Leitha SchullerMichael J Rishik Tubby, MD  ASSISTANTS: None  ANESTHESIA:   spinal  EBL:  Total I/O In: 1500 [I.V.:1500] Out: 850 [Urine:200; Blood:650]  BLOOD ADMINISTERED:none  DRAINS: none   LOCAL MEDICATIONS USED:  MARCAINE     SPECIMEN:  Source of Specimen:  Femoral head  DISPOSITION OF SPECIMEN:  PATHOLOGY  COUNTS:  YES  TOURNIQUET:  * No tourniquets in log *  IMPLANTS: Medacta 6 lateralized AMIS stem with 62 Mpact cup DM with liner and M 28 mm head  DICTATION: .Dragon Dictation   The patient was brought to the operating room and after spinal anesthesia was obtained patient was placed on the operative table with the ipsilateral foot into the Medacta attachment, contralateral leg on a well-padded table. C-arm was brought in and preop template x-ray taken. After prepping and draping in usual sterile fashion appropriate patient identification and timeout procedures were completed. Anterior approach to the hip was obtained and centered over the greater trochanter and TFL muscle. The subcutaneous tissue was incised hemostasis being achieved by electrocautery. TFL fascia was incised and the muscle retracted laterally deep retractor placed. The lateral femoral circumflex vessels were identified and ligated. The anterior capsule was exposed and a capsulotomy performed. The neck was identified and a femoral neck cut carried out with a saw. The head was removed without difficulty and showed sclerotic femoral head and acetabulum. Reaming was carried out to 60 mm and a 62 mm cup trial gave appropriate tightness to the acetabular component a 62 Mpact DM cup was impacted into position. The leg was then externally rotated and ischiofemoral and pubofemoral  releases carried out. The femur was sequentially broached to a size 6, size 6 stem with lateralized neck and M head trials were placed and the final components chosen. The 6 lateralized stem was inserted along with a M 28 mm head and 62 mm liner. The hip was reduced and was stable the wound was thoroughly irrigated pulse lavage. The deep fascia was closed a heavy Quill after infiltration of 30 cc of quarter percent Sensorcaine with epinephrine. TXA was infiltrated into the joint and 2-0Quill to close the skin with skin staples. Xeroform and honeycomb dressing applied  PLAN OF CARE: Admit to inpatient

## 2015-12-05 NOTE — Anesthesia Preprocedure Evaluation (Signed)
Anesthesia Evaluation  Patient identified by MRN, date of birth, ID band Patient awake    Reviewed: Allergy & Precautions, NPO status , Patient's Chart, lab work & pertinent test results  Airway Mallampati: II       Dental no notable dental hx.    Pulmonary sleep apnea ,    Pulmonary exam normal        Cardiovascular Exercise Tolerance: Good hypertension, Pt. on home beta blockers + CAD  + dysrhythmias Atrial Fibrillation + pacemaker  Rhythm:Regular     Neuro/Psych parkinson's    GI/Hepatic GERD  ,  Endo/Other  diabetes, Well Controlled, Type 1, Insulin Dependent  Renal/GU      Musculoskeletal   Abdominal Normal abdominal exam  (+)   Peds  Hematology   Anesthesia Other Findings   Reproductive/Obstetrics                             Anesthesia Physical Anesthesia Plan  ASA: III  Anesthesia Plan: Spinal   Post-op Pain Management:    Induction:   Airway Management Planned: Nasal Cannula  Additional Equipment:   Intra-op Plan:   Post-operative Plan:   Informed Consent: I have reviewed the patients History and Physical, chart, labs and discussed the procedure including the risks, benefits and alternatives for the proposed anesthesia with the patient or authorized representative who has indicated his/her understanding and acceptance.     Plan Discussed with: CRNA  Anesthesia Plan Comments:         Anesthesia Quick Evaluation

## 2015-12-05 NOTE — Evaluation (Signed)
Physical Therapy Evaluation Patient Details Name: Era BumpersCharles A Medico MRN: [id number] DOB: November 09, 1946 Today's Date: 12/05/2015   History of Present Illness  Patient is a pleasant 69 y/o male that presents for R THR on 12/05/2015. He has a history of Parkinson's disease.   Clinical Impression  Patient displayed decreased sensorimotor below his knees bilaterally (no active PF/DF at the ankle and absent sensation below his knees from spinal block). Patient has a history of Parkinson's and demonstrates UE intention tremors with LE movement in this session, this appears to be his baseline. He is able to complete bed exercises directed at his thigh musculature as well as dangle at bedside with minimal complaints of increased pain. Currently PT would recommend SNF at discharge, however this will be updated more appropriately with OOB mobility assessment as spinal block wears off. Skilled PT services will continue to address his mobility deficits.      Follow Up Recommendations SNF (Pending OOB mobility assessment with return of sensorimotor)    Equipment Recommendations  Rolling walker with 5" wheels;3in1 (PT)    Recommendations for Other Services       Precautions / Restrictions Precautions Precautions: Fall;Anterior Hip Restrictions Weight Bearing Restrictions: Yes RLE Weight Bearing: Weight bearing as tolerated Other Position/Activity Restrictions: Anterior hip - Menz      Mobility  Bed Mobility Overal bed mobility: Needs Assistance Bed Mobility: Supine to Sit;Sit to Supine     Supine to sit: Min assist Sit to supine: Mod assist   General bed mobility comments: Patient requires assistance for LEs as he is experiencing generalized weakness for spinal.   Transfers                 General transfer comment: Deferred secondary to decreased sensation.   Ambulation/Gait                Stairs            Wheelchair Mobility    Modified Rankin (Stroke Patients  Only)       Balance Overall balance assessment: Needs assistance Sitting-balance support: No upper extremity supported Sitting balance-Leahy Scale: Fair Sitting balance - Comments: No balance deficits seen in sitting.                                      Pertinent Vitals/Pain Pain Assessment: No/denies pain (Patient denies pain at this time, impaired sensation distal to knee (decreased motor as well). )    Home Living Family/patient expects to be discharged to:: Private residence Living Arrangements: Spouse/significant other Available Help at Discharge: Family;Available 24 hours/day Type of Home: Apartment Home Access: Level entry     Home Layout: One level Home Equipment: Cane - single point      Prior Function Level of Independence: Independent         Comments: Patient has been ambulating independently with no falls prior to this operation.      Hand Dominance        Extremity/Trunk Assessment   Upper Extremity Assessment: Overall WFL for tasks assessed           Lower Extremity Assessment:  (Able to complete LAQs bilaterally, unable to complete PF/DF and minimal to no sensation distal to the knees bilaterally)         Communication   Communication: No difficulties  Cognition Arousal/Alertness: Awake/alert Behavior During Therapy: WFL for tasks assessed/performed Overall Cognitive Status: Within Functional  Limits for tasks assessed                      General Comments      Exercises Total Joint Exercises Gluteal Sets: AROM;Both;10 reps Heel Slides: AROM;AAROM;10 reps;Both Hip ABduction/ADduction: AROM;AAROM;Both;10 reps Straight Leg Raises: AROM;AAROM;Both;10 reps Long Arc Quad: AROM;Both;10 reps      Assessment/Plan    PT Assessment Patient needs continued PT services  PT Diagnosis Difficulty walking;Generalized weakness;Abnormality of gait   PT Problem List Decreased strength;Decreased mobility;Decreased range  of motion;Decreased activity tolerance;Decreased balance  PT Treatment Interventions DME instruction;Therapeutic activities;Therapeutic exercise;Gait training;Stair training;Balance training;Manual techniques   PT Goals (Current goals can be found in the Care Plan section) Acute Rehab PT Goals Patient Stated Goal: To return home safely  PT Goal Formulation: With patient/family Time For Goal Achievement: 12/19/15 Potential to Achieve Goals: Good    Frequency BID   Barriers to discharge        Co-evaluation               End of Session Equipment Utilized During Treatment: Gait belt Activity Tolerance: Patient tolerated treatment well;Treatment limited secondary to medical complications (Comment) (Impaired sensation bilateral LE. ) Patient left: in bed;with bed alarm set;with call bell/phone within reach;with family/visitor present Nurse Communication: Mobility status (Sensorimotor status)         Time: 1610-9604 PT Time Calculation (min) (ACUTE ONLY): 16 min   Charges:   PT Evaluation $Initial PT Evaluation Tier I: 1 Procedure     PT G Codes:       Kerin Ransom, PT, DPT    12/05/2015, 4:30 PM

## 2015-12-05 NOTE — Progress Notes (Signed)
Completed Health Directive Education with patient.  He will have the nurse page the Chaplain when he is ready to complete it. Chaplain Ronney Liononna S Lawsen Arnott Ext 1200

## 2015-12-06 LAB — GLUCOSE, CAPILLARY
GLUCOSE-CAPILLARY: 132 mg/dL — AB (ref 65–99)
GLUCOSE-CAPILLARY: 179 mg/dL — AB (ref 65–99)
Glucose-Capillary: 168 mg/dL — ABNORMAL HIGH (ref 65–99)
Glucose-Capillary: 136 mg/dL — ABNORMAL HIGH (ref 65–99)
Glucose-Capillary: 154 mg/dL — ABNORMAL HIGH (ref 65–99)

## 2015-12-06 NOTE — Progress Notes (Signed)
   Subjective: 1 Day Post-Op Procedure(s) (LRB): TOTAL HIP ARTHROPLASTY ANTERIOR APPROACH (Right) Patient reports pain as 0 on 0-10 scale.   Patient is well, and has had no acute complaints or problems We will start therapy today.  Plan is to go Home after hospital stay. no nausea and no vomiting Patient denies any chest pains or shortness of breath. Objective: Vital signs in last 24 hours: Temp:  [96.7 F (35.9 C)-99.4 F (37.4 C)] 97.8 F (36.6 C) (12/28 0340) Pulse Rate:  [73-82] 79 (12/28 0340) Resp:  [16-26] 17 (12/28 0340) BP: (94-133)/(52-88) 129/65 mmHg (12/28 0340) SpO2:  [94 %-100 %] 96 % (12/28 0340) Weight:  [109.317 kg (241 lb)] 109.317 kg (241 lb) (12/27 0807) well approximated incision Heels are non tender and elevated off the bed using rolled towels Intake/Output from previous day: 12/27 0701 - 12/28 0700 In: 3411.3 [P.O.:410; I.V.:2901.3; IV Piggyback:100] Out: 2725 [Urine:2075; Blood:650] Intake/Output this shift: Total I/O In: 971.3 [P.O.:170; I.V.:801.3] Out: 1775 [Urine:1775]   Recent Labs  12/05/15 1231  HGB 10.9*    Recent Labs  12/05/15 1231  WBC 8.2  RBC 3.74*  HCT 33.1*  PLT 141*    Recent Labs  12/05/15 1231  CREATININE 0.83   No results for input(s): LABPT, INR in the last 72 hours.  EXAM General - Patient is Alert, Appropriate and Oriented Extremity - Neurologically intact Neurovascular intact Sensation intact distally Intact pulses distally Dorsiflexion/Plantar flexion intact Dressing - dressing C/D/I Motor Function - intact, moving foot and toes well on exam.    Past Medical History  Diagnosis Date  . Hypertension   . Diabetes mellitus without complication (HCC)     Type 2, insulin dependent  . GERD (gastroesophageal reflux disease)   . Parkinson's disease (HCC)   . 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 Pacemaker   . History of atrial fibrillation   . History of CEA (carotid endarterectomy)    left side  . Coronary artery disease   . Myocardial infarction (HCC)   . Dysrhythmia   . Asthma   . Seasonal allergies   . Injury of leg, left     Lt leg reddened and draining on antibiotics  . Cellulitis     ? on Lt leg on antibiotics    Assessment/Plan: 1 Day Post-Op Procedure(s) (LRB): TOTAL HIP ARTHROPLASTY ANTERIOR APPROACH (Right) Active Problems:   Primary osteoarthritis of right hip  Estimated body mass index is 28.57 kg/(m^2) as calculated from the following:   Height as of this encounter: 6\' 5"  (1.956 m).   Weight as of this encounter: 109.317 kg (241 lb). Advance diet Up with therapy D/C IV fluids Plan for discharge tomorrow Discharge home with home health  Labs: reviewed labs post op. Today's labs still pending DVT Prophylaxis - Lovenox, Foot Pumps and TED hose Weight-Bearing as tolerated to right leg D/C O2 and Pulse OX and try on Room Air Begin working on a bowel movement.  Lynnda ShieldsJon R. Chardon Surgery CenterWolfe PA [facility] 12/06/2015, 6:24 AM

## 2015-12-06 NOTE — Progress Notes (Signed)
POD1 R THR.Plan of care discussed with patient. Pain controlled with PRN medication. Patient states that he would prefer to notify staff for pain medication. Patient demonstrates correct use of incentive spirometer. Ice pack to hip. Family at bedside. Patient resting between care.

## 2015-12-06 NOTE — Clinical Social Work Note (Signed)
CSW awaiting PT assessment for today. Patient was limited with PT yesterday due to spinal block. PT to return to see patient's functioning today. Ortho MD has documented expected discharge to be home. York SpanielMonica Aalliyah [name] MSW,LCSW [phone]

## 2015-12-06 NOTE — Care Management Note (Signed)
Case Management Note  Patient Details  Name: Ian [name] MRN: [id number] Date of Birth: [birth year]  Subjective/Objective:  RNCM attempted to meet with patient. Patient lying in bed with eyes closed and snoring. RNCm did not wake patient up                   Action/Plan: Will follow up for home health needs  Expected Discharge Date:                  Expected Discharge Plan:     In-House Referral:     Discharge planning Services     Post Acute Care Choice:    Choice offered to:     DME Arranged:    DME Agency:     HH Arranged:    HH Agency:     Status of Service:     Medicare Important Message Given:    Date Medicare IM Given:    Medicare IM give by:    Date Additional Medicare IM Given:    Additional Medicare Important Message give by:     If discussed at Long Length of Stay Meetings, dates discussed:    Additional Comments:  Marily MemosLisa M Kerington Hildebrant, RN 12/06/2015, 2:33 PM

## 2015-12-06 NOTE — Progress Notes (Signed)
Physical Therapy Treatment Patient Details Name: Ian BumpersCharles A [name] MRN: [id number] DOB: [birth year] Today's Date: 12/06/2015    History of Present Illness Patient is a pleasant 69 y/o male that presents for R THR on 12/05/2015. He has a history of Parkinson's disease.     PT Comments    Patient is able to demonstrate improved gait mechanics and transfer skills in this session, though still fairly limited with bed mobility. He has a recliner at home he is able to sleep in as he develops strength to manage bed mobility. Patient demonstrates no loss of balance, and no issues with tremors in his UEs while negotiating ambulation with RW. Initially he requires significant trunk flexion to complete sit to stand transfer, though with cuing and additional repetitions he is able to complete with less trunk flexion and increased speed. Skilled PT services continue to be indicated to address the above deficits.   Follow Up Recommendations  Home health PT     Equipment Recommendations  Rolling walker with 5" wheels;3in1 (PT)    Recommendations for Other Services       Precautions / Restrictions Precautions Precautions: Fall;Anterior Hip Restrictions Weight Bearing Restrictions: Yes RLE Weight Bearing: Weight bearing as tolerated Other Position/Activity Restrictions: Anterior hip - Menz    Mobility  Bed Mobility Overal bed mobility: Needs Assistance Bed Mobility: Sit to Supine     Supine to sit: Min guard;Min assist Sit to supine: Mod assist;Min assist   General bed mobility comments: Patient required heavy use of UEs and assistance with RLE to complete bed mobility, he is able to complete with very minimal PT assistance for RLE, he is able to manage his torso without assistance.   Transfers Overall transfer level: Needs assistance Equipment used: Rolling walker (2 wheeled) Transfers: Sit to/from Stand Sit to Stand: Min guard         General transfer comment: Patient instructed in  hand position and foot placement for transfer, he is able to complete multiple repetitions with no loss of balance, though flexed trunk and prolonged time. Improved with cuing to supervision-cga x1.   Ambulation/Gait Ambulation/Gait assistance: Min guard Ambulation Distance (Feet): 100 Feet Assistive device: Rolling walker (2 wheeled) Gait Pattern/deviations: Narrow base of support;Trunk flexed;Decreased step length - right;Decreased step length - left;Step-through pattern   Gait velocity interpretation: Below normal speed for age/gender General Gait Details: Patient demonstrates increased stride length with cuing, though decreased from his baseline. Appropriate knee flexion to initiate gait pattern. No buckling/loss of balance noted.    Stairs            Wheelchair Mobility    Modified Rankin (Stroke Patients Only)       Balance Overall balance assessment: Needs assistance Sitting-balance support: Bilateral upper extremity supported Sitting balance-Leahy Scale: Good Sitting balance - Comments: No balance deficits seen in sitting.    Standing balance support: Bilateral upper extremity supported Standing balance-Leahy Scale: Fair Standing balance comment: Improved stride length and gait speed in this session, though below normal for his age. Continues to require anterior lean/trunk flexion to initiate standing with prolonged time indicating falls risk.                     Cognition Arousal/Alertness: Awake/alert Behavior During Therapy: WFL for tasks assessed/performed Overall Cognitive Status: Within Functional Limits for tasks assessed                      Exercises Total Joint Exercises Ankle Circles/Pumps:  AROM;10 reps;Both Gluteal Sets: AROM;Both;10 reps Heel Slides: AROM;AAROM;10 reps;Both Hip ABduction/ADduction: AROM;AAROM;Both;10 reps Straight Leg Raises: AROM;AAROM;Both;10 reps Long Arc Quad: AROM;Both;10 reps Other Exercises Other  Exercises: Completed sit to stand training with cuing for foot and hand placement on RW x 10 repetitions.     General Comments        Pertinent Vitals/Pain Pain Assessment:  (Patient reports mild tingling around incision site, he is more bothered by pain/discomfort in his gluteal region from sitting in the chair. )    Home Living                      Prior Function            PT Goals (current goals can now be found in the care plan section) Acute Rehab PT Goals Patient Stated Goal: To go home PT Goal Formulation: With patient/family Time For Goal Achievement: 12/19/15 Potential to Achieve Goals: Good Progress towards PT goals: Progressing toward goals    Frequency  BID    PT Plan Current plan remains appropriate    Co-evaluation             End of Session Equipment Utilized During Treatment: Gait belt Activity Tolerance: Patient tolerated treatment well Patient left: in bed;with call bell/phone within reach;with bed alarm set     Time: 6962-9528 PT Time Calculation (min) (ACUTE ONLY): 25 min  Charges:  $Gait Training: 8-22 mins $Therapeutic Exercise: 8-22 mins                    G Codes:      Kerin Ransom, PT, DPT    12/06/2015, 4:33 PM

## 2015-12-06 NOTE — Care Management Note (Signed)
Case Management Note  Patient Details  Name: Ian [name] MRN: [id number] Date of Birth: October 25, 1946  Subjective/Objective:                  Spoke with patient's wife Ian [name] over the phone regarding discharge planning. He lives with her and she states that she would rather RNCM to speak with him in her presence to reduce patient anxiety. I explained that RNCM and CSW follow ortho patients with dual plans in case status changes at time of discharge. She states he would rather return home and that he "told her that he walked to the nurses station today". I explained that OT was recommending a hit kit which can be picked up at any medical supply center. She is not sure what agency he would like for home health services and would like to see a list. He does not have any DME at home. She requests a rolling walker and a bedside commode. She states he uses [facility] for Rx [phone].  Action/Plan: List of home health care agencies left in patient's room. [facility] if [facility]. Rolling walker and bedside commode requested from Will with [facility]. RNCM will continue to follow.   Expected Discharge Date:                  Expected Discharge Plan:     In-House Referral:     Discharge planning Services  CM Consult  Post Acute Care Choice:  Durable Medical Equipment, Home Health Choice offered to:  Patient  DME Arranged:    DME Agency:     HH Arranged:    HWilliamsAgency:     Status of Service:  In process, will continue to follow  Medicare Important Message Given:    Date Medicare IM Given:    Medicare IM give by:    Date Additional Medicare IM Given:    Additional Medicare Important Message give by:     If discussed at LKentof Stay Meetings, dates discussed:    Additional Comments:  AMarshell Garfinkel RN 12/06/2015, 3:02 PM

## 2015-12-06 NOTE — Progress Notes (Signed)
Physical Therapy Treatment Patient Details Name: Ian [name] MRN: [id number] DOB: 1946-06-03 Today's Date: 12/06/2015    History of Present Illness Patient is a pleasant 69 y/o male that presents for R THR on 12/05/2015. He has a history of Parkinson's disease.     PT Comments    Patient reporting minimal for this session and is able to ambulate to and from RN station with RW and no loss of balance. Noted to have rounded shoulders/flexed posture and minimal step lengths noted bilaterally. Currently demonstrating deficits in bed mobility, gait speed,and sit to stand transfers secondary to LE weakness.Patient is able to complete all bed exercises with no increase in pain and is progressing well towards established mobility goals. Patient would benefit from additional PT services to address his mobility deficits.   Follow Up Recommendations  Home health PT     Equipment Recommendations  Rolling walker with 5" wheels;3in1 (PT)    Recommendations for Other Services       Precautions / Restrictions Precautions Precautions: Fall;Anterior Hip Restrictions Weight Bearing Restrictions: Yes RLE Weight Bearing: Weight bearing as tolerated Other Position/Activity Restrictions: Anterior hip - Menz    Mobility  Bed Mobility Overal bed mobility: Needs Assistance Bed Mobility: Supine to Sit     Supine to sit: Min guard;Min assist     General bed mobility comments: Patient required heavy use of UEs and assistance with RLE to complete bed mobility, he is able to complete with very minimal PT assistance for RLE, he is able to manage his torso without assistance.   Transfers Overall transfer level: Needs assistance Equipment used: Rolling walker (2 wheeled) Transfers: Sit to/from Stand Sit to Stand: Min assist;+2 physical assistance         General transfer comment: Patient requires prolonged time to complete sit to stand transfer with cuing for hand placement on RW. Wide stance  with poor leg placement noted.   Ambulation/Gait Ambulation/Gait assistance: Min guard Ambulation Distance (Feet): 30 Feet Assistive device: Rolling walker (2 wheeled) Gait Pattern/deviations: Decreased step length - right;Decreased step length - left;Step-through pattern;Trunk flexed;Narrow base of support   Gait velocity interpretation: Below normal speed for age/gender General Gait Details: Patient displays minimal step lengths with flexed trunk and narrow BOS during ambulation. No buckling or loss of balance noted. Patient is able to increase stride length with cuing.    Stairs            Wheelchair Mobility    Modified Rankin (Stroke Patients Only)       Balance Overall balance assessment: Needs assistance Sitting-balance support: No upper extremity supported Sitting balance-Leahy Scale: Fair Sitting balance - Comments: No balance deficits seen in sitting.    Standing balance support: Bilateral upper extremity supported Standing balance-Leahy Scale: Fair Standing balance comment: No buckling or loss of balance in standing/ambulation noted, though minimal stride lengths indicate he is at higher risk for falling.                     Cognition Arousal/Alertness: Awake/alert Behavior During Therapy: WFL for tasks assessed/performed Overall Cognitive Status: Within Functional Limits for tasks assessed                      Exercises Total Joint Exercises Ankle Circles/Pumps: AROM;10 reps;Both Gluteal Sets: AROM;Both;10 reps Heel Slides: AROM;AAROM;10 reps;Both Hip ABduction/ADduction: AROM;AAROM;Both;10 reps Straight Leg Raises: AROM;AAROM;Both;10 reps Long Arc Quad: AROM;Both;10 reps Other Exercises Other Exercises: Standing heel raises x 10 repetitions with  bilateral HHA.     General Comments        Pertinent Vitals/Pain Pain Assessment:  (Patient reports minimal pain, more soreness in operative hip)    Home Living                       Prior Function            PT Goals (current goals can now be found in the care plan section) Acute Rehab PT Goals Patient Stated Goal: To go home PT Goal Formulation: With patient/family Time For Goal Achievement: 12/19/15 Potential to Achieve Goals: Good Progress towards PT goals: Progressing toward goals    Frequency  BID    PT Plan Discharge plan needs to be updated    Co-evaluation             End of Session Equipment Utilized During Treatment: Gait belt Activity Tolerance: Patient tolerated treatment well Patient left: in chair;with call bell/phone within reach;with chair alarm set     Time: [id number] PT Time Calculation (min) (ACUTE ONLY): 30 min  Charges:  $Gait Training: 8-22 mins $Therapeutic Exercise: 8-22 mins                    G Codes:      Kerin RansomPatrick A Detta Mellin, PT, DPT    12/06/2015, 3:12 PM

## 2015-12-06 NOTE — Evaluation (Signed)
Occupational Therapy Evaluation Patient Details Name: Ian [name] MRN: [id number] DOB: [birth year] Today's Date: 12/06/2015    History of Present Illness This patient is a 69 year old male who came to [facility] for a R THR on 12/05/2015. He has a history of Parkinson's disease and has tremors.    Clinical Impression   This patient is a 69 year old male who came to [facility] for a R total hip replacement (anterior approach).  Patient lives in a entry level apartment  with his wife.  He had been independent with ADL and functional mobility and works.  He has surgical pain and he now requires assistance.  With basic activities of daily living including dressing and would benefit from Occupational Therapy for ADL/functional mobility training while .staying within hip precautions (anterior approach).      Follow Up Recommendations  SNF    Equipment Recommendations       Recommendations for Other Services       Precautions / Restrictions Precautions Precautions: Fall;Anterior Hip Restrictions Weight Bearing Restrictions: Yes RLE Weight Bearing: Weight bearing as tolerated      Mobility Bed Mobility                  Transfers                      Balance                                            ADL                                         General ADL Comments: Patient had been independent and working full time.  He now needs assist with dressing and mobility. Today practiced techniques for lower body dressing using hip kit. Patient attempted to reach feet with out hip kit, but was unable. Illustrated technique for lower body dressing using hip kit with hand over hand assist.  Practiced techniques for donning and doffing pants and socks.     Vision     Perception     Praxis      Pertinent Vitals/Pain       Hand Dominance     Extremity/Trunk Assessment Upper  Extremity Assessment Upper Extremity Assessment: Overall WFL for tasks assessed   Lower Extremity Assessment Lower Extremity Assessment: Defer to PT evaluation       Communication Communication Communication: No difficulties   Cognition Arousal/Alertness: Awake/alert Behavior During Therapy: WFL for tasks assessed/performed Overall Cognitive Status: Within Functional Limits for tasks assessed                     General Comments       Exercises       Shoulder Instructions      Home Living Family/patient expects to be discharged to:: Private residence Living Arrangements: Spouse/significant other Available Help at Discharge: Family;Available 24 hours/day Type of Home: Apartment Home Access: Level entry     Home Layout: One level         Bathroom Toilet: Standard Bathroom Accessibility: Yes   Home Equipment: Cane - single point          Prior Functioning/Environment Level of Independence: Independent  Comments: Works    OT Diagnosis: Acute pain   OT Problem List: Decreased range of motion;Decreased activity tolerance;Decreased knowledge of use of DME or AE;Decreased knowledge of precautions;Pain   OT Treatment/Interventions: Self-care/ADL training    OT Goals(Current goals can be found in the care plan section) Acute Rehab OT Goals Patient Stated Goal: To go home OT Goal Formulation: With patient/family Time For Goal Achievement: 12/20/15 Potential to Achieve Goals: Good  OT Frequency: Min 1X/week   Barriers to D/C:            Co-evaluation              End of Session Equipment Utilized During Treatment:  (Hip kit)  Activity Tolerance:   Patient left: in bed;with call bell/phone within reach;with bed alarm set;with family/visitor present   Time: 0315-9458 OT Time Calculation (min): 16 min Charges:  OT General Charges $OT Visit: 1 Procedure OT Evaluation $Initial OT Evaluation Tier I: 1 Procedure G-Codes:    Myrene Galas, MS/OTR/L  12/06/2015, 9:43 AM

## 2015-12-07 LAB — SURGICAL PATHOLOGY

## 2015-12-07 LAB — GLUCOSE, CAPILLARY: GLUCOSE-CAPILLARY: 214 mg/dL — AB (ref 65–99)

## 2015-12-07 MED ORDER — OXYCODONE HCL 5 MG PO TABS
5.0000 mg | ORAL_TABLET | ORAL | Status: DC | PRN
Start: 2015-12-07 — End: 2016-01-18

## 2015-12-07 MED ORDER — ENOXAPARIN SODIUM 40 MG/0.4ML ~~LOC~~ SOLN: 40.0000 mg | SUBCUTANEOUS | Status: DC

## 2015-12-07 MED ORDER — LACTULOSE 10 GM/15ML PO SOLN: 10.0000 g | Freq: Two times a day (BID) | ORAL | Status: DC | PRN

## 2015-12-07 NOTE — Progress Notes (Signed)
   Subjective: 2 Days Post-Op Procedure(s) (LRB): TOTAL HIP ARTHROPLASTY ANTERIOR APPROACH (Right) Patient reports pain as moderate.   Patient is well, and has had no acute complaints or problems Continue with physical therapy today.  Plan is to go Home after hospital stay. no nausea and no vomiting Patient denies any chest pains or shortness of breath. Objective: Vital signs in last 24 hours: Temp:  [98.4 F (36.9 C)-99.8 F (37.7 C)] 98.8 F (37.1 C) (12/29 0504) Pulse Rate:  [58-79] 74 (12/29 0504) Resp:  [17-19] 19 (12/29 0504) BP: (94-130)/(56-99) 94/56 mmHg (12/29 0504) SpO2:  [90 %-98 %] 98 % (12/29 0504) well approximated incision Heels are non tender and elevated off the bed using rolled towels Intake/Output from previous day: 12/28 0701 - 12/29 0700 In: 765 [P.O.:240; I.V.:525] Out: 0  Intake/Output this shift:     Recent Labs  12/05/15 1231  HGB 10.9*    Recent Labs  12/05/15 1231  WBC 8.2  RBC 3.74*  HCT 33.1*  PLT 141*    Recent Labs  12/05/15 1231  CREATININE 0.83   No results for input(s): LABPT, INR in the last 72 hours.  EXAM General - Patient is Alert, Appropriate and Oriented Extremity - Neurologically intact Neurovascular intact Sensation intact distally Intact pulses distally Dorsiflexion/Plantar flexion intact Dressing - scant drainage Motor Function - intact, moving foot and toes well on exam.    Past Medical History  Diagnosis Date  . Hypertension   . Diabetes mellitus without complication (HCC)     Type 2, insulin dependent  . GERD (gastroesophageal reflux disease)   . Parkinson's disease (HCC)   . 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 Pacemaker   . History of atrial fibrillation   . History of CEA (carotid endarterectomy)     left side  . Coronary artery disease   . Myocardial infarction (HCC)   . Dysrhythmia   . Asthma   . Seasonal allergies   . Injury of leg, left     Lt leg reddened and  draining on antibiotics  . Cellulitis     ? on Lt leg on antibiotics    Assessment/Plan: 2 Days Post-Op Procedure(s) (LRB): TOTAL HIP ARTHROPLASTY ANTERIOR APPROACH (Right) Active Problems:   Primary osteoarthritis of right hip  Estimated body mass index is 28.57 kg/(m^2) as calculated from the following:   Height as of this encounter: 6\' 5"  (1.956 m).   Weight as of this encounter: 109.317 kg (241 lb). Up with therapy Discharge home with home health today after pt has a bowel movement and does his lap and steps  Labs: reviewed DVT Prophylaxis - Lovenox, Foot Pumps and TED hose Weight-Bearing as tolerated to right leg Lactulose ordered. Needs a bm prior to d/c Please change dressing prior to d/c  Winton Offord [facility]. [facility]fe PA [facility] 12/07/2015, 6:33 AM

## 2015-12-07 NOTE — Care Management Important Message (Signed)
Important Message  Patient Details  Name: Era BumpersCharles A Parodi MRN: [id number] Date of Birth: 1946-01-25   Medicare Important Message Given:  Yes    Olegario MessierKathy A Toshiyuki Fredell 12/07/2015, 9:40 AM

## 2015-12-07 NOTE — Clinical Social Work Note (Signed)
CSW consulted in the event PT recommends STR for patient. PT has assessed patient and recommends home health. Please reconsult CSW if needed. York SpanielMonica Ruthetta Koopmann MSW,LCSW [phone]

## 2015-12-07 NOTE — Discharge Instructions (Signed)
Patient Name Sex DOB SSN  Ian [name]  @GENDER @ 04-19-1946 [id number]    [facility]Sallyanne Birkhead R.   Author: Tera PartridgeWOLFE,Keishawn Rajewski R. Service: Orthopedics Author Type: Physician Assistant    Filed: @T @ Note Time: 6:45 AM Status: Signed   Editor: Tera PartridgeWOLFE,Sharica Roedel R., PA     Expand All Collapse All   ANTERIOR APPROACH TOTAL HIP REPLACEMENT POSTOPERATIVE DIRECTIONS   Hip Rehabilitation, Guidelines Following Surgery  The results of a hip operation are greatly improved after range of motion and muscle strengthening exercises. Follow all safety measures which are given to protect your hip. If any of these exercises cause increased pain or swelling in your joint, decrease the amount until you are comfortable again. Then slowly increase the exercises. Call your caregiver if you have problems or questions.   HOME CARE INSTRUCTIONS   Remove items at home which could result in a fall. This includes throw rugs or furniture in walking pathways.   ICE to the affected hip every three hours for 30 minutes at a time and then as needed for pain and swelling. Continue to use ice on the hip for pain and swelling from surgery. You may notice swelling that will progress down to the foot and ankle. This is normal after surgery. Elevate the leg when you are not up walking on it.   Continue to use the breathing machine which will help keep your temperature down. It is common for your temperature to cycle up and down following surgery, especially at night when you are not up moving around and exerting yourself. The breathing machine keeps your lungs expanded and your temperature down.  Do not place pillow under knee, focus on keeping the knee straight while resting  DIET You may resume your previous home diet once your are discharged from the hospital.  DRESSING / WOUND CARE / SHOWERING Change the surgical dressing as needed If the wound gets wet inside, change the dressing with sterile gauze.  Please use good hand washing techniques before changing the dressing. Do not use any lotions or creams on the incision until instructed by your surgeon.Keep your dressing dry with showering.  Keep your dressing dry with showering. You can keep it covered and pat dry.  STAPLE REMOVAL: Please remove staples two weeks post op and apply benzoin and 1/2 inch steri strips  ACTIVITY Walk with your walker as instructed. Use walker as long as suggested by your caregivers. Avoid periods of inactivity such as sitting longer than an hour when not asleep. This helps prevent blood clots.  You may resume a sexual relationship in one month or when given the OK by your doctor.  You may return to work once you are cleared by your doctor.  Do not drive a car for 6 weeks or until released by you surgeon.  Do not drive while taking narcotics.  WEIGHT BEARING Weight bearing as tolerated with assist device (walker, cane, etc) as directed, use it as long as suggested by your surgeon or therapist, typically at least 4-6 weeks.  POSTOPERATIVE CONSTIPATION PROTOCOL Constipation - defined medically as fewer than three stools per week and severe constipation as less than one stool per week.  One of the most common issues patients have following surgery is constipation. Even if you have a regular bowel pattern at home, your normal regimen is likely to be disrupted due to multiple reasons following surgery. Combination of anesthesia, postoperative narcotics, change in appetite and fluid intake all can affect your bowels.  In order to avoid complications following surgery, here are some recommendations in order to help you during your recovery period.  Colace (docusate) - Pick up an over-the-counter form of Colace or another stool softener and take twice a day as long as you are requiring postoperative pain medications. Take with a full glass of water daily. If you experience loose stools or diarrhea, hold the  colace until you stool forms back up. If your symptoms do not get better within 1 week or if they get worse, check with your doctor.  Dulcolax (bisacodyl) - Pick up over-the-counter and take as directed by the product packaging as needed to assist with the movement of your bowels. Take with a full glass of water. Use this product as needed if not relieved by Colace only.   MiraLax (polyethylene glycol) - Pick up over-the-counter to have on hand. MiraLax is a solution that will increase the amount of water in your bowels to assist with bowel movements. Take as directed and can mix with a glass of water, juice, soda, coffee, or tea. Take if you go more than two days without a movement. Do not use MiraLax more than once per day. Call your doctor if you are still constipated or irregular after using this medication for 7 days in a row.  If you continue to have problems with postoperative constipation, please contact the office for further assistance and recommendations. If you experience "the worst abdominal pain ever" or develop nausea or vomiting, please contact the office immediatly for further recommendations for treatment.  ITCHING If you experience itching with your medications, try taking only a single pain pill, or even half a pain pill at a time. You can also use Benadryl over the counter for itching or also to help with sleep.   TED HOSE STOCKINGS Wear the elastic stockings on both legs for 6 weeks following surgery during the day but you may remove then at night for sleeping.  MEDICATIONS See your medication summary on the After Visit Summary that the nursing staff will review with you prior to discharge. You may have some home medications which will be placed on hold until you complete the course of blood thinner medication. It is important for you to complete the blood thinner medication as prescribed by your surgeon. Continue your approved medications as instructed at time of  discharge.  PRECAUTIONS If you experience chest pain or shortness of breath - call 911 immediately for transfer to the hospital emergency department.  If you develop a fever greater that 101 F, purulent drainage from wound, increased redness or drainage from wound, foul odor from the wound/dressing, or calf pain - CONTACT YOUR SURGEON.    FOLLOW-UP APPOINTMENTS Make sure you keep all of your appointments after your operation with your surgeon and caregivers. You should call the office at the above phone number and make an appointment for approximately two weeks after the date of your surgery or on the date instructed by your surgeon outlined in the "After Visit Summary".  RANGE OF MOTION AND STRENGTHENING EXERCISES  These exercises are designed to help you keep full movement of your hip joint. Follow your caregiver's or physical therapist's instructions. Perform all exercises about fifteen times, three times per day or as directed. Exercise both hips, even if you have had only one joint replacement. These exercises can be done on a training (exercise) mat, on the floor, on a table or on a bed. Use whatever works the best and is  most comfortable for you. Use music or television while you are exercising so that the exercises are a pleasant break in your day. This will make your life better with the exercises acting as a break in routine you can look forward to.   Lying on your back, slowly slide your foot toward your buttocks, raising your knee up off the floor. Then slowly slide your foot back down until your leg is straight again.   Lying on your back spread your legs as far apart as you can without causing discomfort.   Lying on your side, raise your upper leg and foot straight up from the floor as far as is comfortable. Slowly lower the leg and repeat.   Lying on your back, tighten up the muscle in the front of your thigh (quadriceps muscles).  You can do this by keeping your leg straight and trying to raise your heel off the floor. This helps strengthen the largest muscle supporting your knee.   Lying on your back, tighten up the muscles of your buttocks both with the legs straight and with the knee bent at a comfortable angle while keeping your heel on the floor.  IF YOU ARE TRANSFERRED TO A SKILLED REHAB FACILITY If the patient is transferred to a skilled rehab facility following release from the hospital, a list of the current medications will be sent to the facility for the patient to continue. When discharged from the skilled rehab facility, please have the facility set up the patient's [place] prior to being released. Also, the skilled facility will be responsible for providing the patient with their medications at time of release from the facility to include their pain medication, the muscle relaxants, and their blood thinner medication. If the patient is still at the rehab facility at time of the two week follow up appointment, the skilled rehab facility will also need to assist the patient in arranging follow up appointment in our office and any transportation needs.  MAKE SURE YOU:   Understand these instructions.   Get help right away if you are not doing well or get worse.   Pick up stool softner and laxative for home use following surgery while on pain medications. Do not submerge incision under water. Please use good hand washing techniques while changing dressing each day. May shower starting three days after surgery. Please use a clean towel to pat the incision dry following showers. Continue to use ice for pain and swelling after surgery. Do not use any lotions or creams on the incision until instructed by your surgeon.

## 2015-12-07 NOTE — Discharge Summary (Signed)
Physician Discharge Summary  Patient ID: Ian [name] MRN: [id number] DOB/AGE: [birth year] 68 y.o.  Admit date: 12/05/2015 Discharge date: 12/07/2015  Admission Diagnoses:  OSTEOARTHRITIS   Discharge Diagnoses: Patient Active Problem List   Diagnosis Date Noted  . Primary osteoarthritis of right hip 12/05/2015  . Cardiomyopathy, ischemic 10/09/2014  . Obstructive apnea 11/13/2011  . Atrial flutter (HCC) 11/12/2011  . Arteriosclerosis of coronary artery 11/12/2011  . Artificial cardiac pacemaker 11/12/2011  . Colon polyp 11/12/2011  . CAFL (chronic airflow limitation) (HCC) 11/12/2011  . Diabetes (HCC) 11/12/2011  . Deep vein thrombosis of lower extremity (HCC) 11/12/2011  . HLD (hyperlipidemia) 11/12/2011  . AF (paroxysmal atrial fibrillation) (HCC) 11/12/2011  . Esophagitis, reflux 11/12/2011  . Sick sinus syndrome (HCC) 11/12/2011  . Has a tremor 11/12/2011    Past Medical History  Diagnosis Date  . Hypertension   . Diabetes mellitus without complication (HCC)     Type 2, insulin dependent  . GERD (gastroesophageal reflux disease)   . Parkinson's disease (HCC)   . 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 Pacemaker   . History of atrial fibrillation   . History of CEA (carotid endarterectomy)     left side  . Coronary artery disease   . Myocardial infarction (HCC)   . Dysrhythmia   . Asthma   . Seasonal allergies   . Injury of leg, left     Lt leg reddened and draining on antibiotics  . Cellulitis     ? on Lt leg on antibiotics     Transfusion: none given during this admission   Consultants (if any):  Case management for home health assistance  Discharged Condition: Improved  Hospital Course: Ian [name] is an 69 y.o. male who was admitted 12/05/2015 with a diagnosis of degenerative arthrosis of right hip and went to the operating room on 12/05/2015 and underwent the above named procedures.    Surgeries:Procedure(s): TOTAL HIP  ARTHROPLASTY ANTERIOR APPROACH on 12/05/2015  PRE-OPERATIVE DIAGNOSIS: OSTEOARTHRITIS, right hip  POST-OPERATIVE DIAGNOSIS: OSTEOARTHRITIS, right hip  PROCEDURE: Procedure(s): TOTAL HIP ARTHROPLASTY ANTERIOR APPROACH (Right)  SURGEON: Leitha Schuller, MD  ASSISTANTS: None  ANESTHESIA: spinal  EBL: Total I/O In: 1500 [I.V.:1500] Out: 850 [Urine:200; Blood:650]  BLOOD ADMINISTERED:none  DRAINS: none   LOCAL MEDICATIONS USED: MARCAINE   SPECIMEN: Source of Specimen: Femoral head  DISPOSITION OF SPECIMEN: PATHOLOGY  COUNTS: YES  TOURNIQUET: * No tourniquets in log *  IMPLANTS: Medacta 6 lateralized AMIS stem with 62 Mpact cup DM with liner and M 28 mm head Patient tolerated the surgery well. No complications .Patient was taken to PACU where she was stabilized and then transferred to the orthopedic floor.  Patient started on Lovenox 40 q 24 hrs. Foot pumps applied bilaterally at 80 mm hg. Heels elevated off bed with rolled towels. No evidence of DVT. Calves non tender. Negative Homan. Physical therapy started on day #1 for gait training and transfer with OT starting on  day #1 for ADL and assisted devices. Patient has done well with therapy. Ambulated greater than 200 feet upon being discharged. Was able to go up 4 step independently without a problem.   Patient's IV and foley were d/c on day # 1 .   He was given perioperative antibiotics:  Anti-infectives    Start     Dose/Rate Route Frequency Ordered Stop   12/05/15 1230  ceFAZolin (ANCEF) IVPB 2 g/50 mL premix  2 g 100 mL/hr over 30 Minutes Intravenous Every 6 hours 12/05/15 1228 12/06/15 0056   12/05/15 0815  ceFAZolin (ANCEF) IVPB 2 g/50 mL premix     2 g 100 mL/hr over 30 Minutes Intravenous  Once 12/05/15 0801 12/05/15 0942   12/05/15 0558  ceFAZolin (ANCEF) 2-3 GM-% IVPB SOLR    Comments:  Hallaji, Violet Ann: cabinet override      12/05/15 0558 12/05/15 1759    .  He was fitted with AV 1  compression foot pumps bilateral , early ambulation, and TED stocking and instruction on heel pumps for DVT prophylaxis.  He benefited maximally from the hospital stay and there were no complications.    Recent vital signs:  Filed Vitals:   12/06/15 2105 12/07/15 0504  BP: 130/99 94/56  Pulse: 58 74  Temp: 99.8 F (37.7 C) 98.8 F (37.1 C)  Resp: 18 19    Recent laboratory studies:  Lab Results  Component Value Date   HGB 10.9* 12/05/2015   HGB 13.3 11/24/2015   HGB 13.9 02/03/2015   Lab Results  Component Value Date   WBC 8.2 12/05/2015   PLT 141* 12/05/2015   Lab Results  Component Value Date   INR 1.06 11/24/2015   Lab Results  Component Value Date   NA 138 11/24/2015   K 5.0 11/24/2015   CL 104 11/24/2015   CO2 26 11/24/2015   BUN 26* 11/24/2015   CREATININE 0.83 12/05/2015   GLUCOSE 138* 11/24/2015    Discharge Medications:     Medication List    STOP taking these medications        HYDROcodone-acetaminophen 5-325 MG tablet  Commonly known as:  NORCO/VICODIN      TAKE these medications        aspirin 81 MG tablet  Take 81 mg by mouth daily.     budesonide-formoterol 160-4.5 MCG/ACT inhaler  Commonly known as:  SYMBICORT  Inhale 2 puffs into the lungs 2 (two) times daily as needed.     carbidopa-levodopa 25-100 MG tablet  Commonly known as:  SINEMET IR  Take 2 tablets by mouth 3 (three) times daily.     carbidopa-levodopa 50-200 MG tablet  Commonly known as:  SINEMET CR  Take 1 tablet by mouth at bedtime.     cephALEXin 500 MG capsule  Commonly known as:  KEFLEX  Take 500 mg by mouth 3 (three) times daily.     enoxaparin 40 MG/0.4ML injection  Commonly known as:  LOVENOX  Inject 0.4 mLs (40 mg total) into the skin daily.     esomeprazole 20 MG capsule  Commonly known as:  NEXIUM  Take 20 mg by mouth daily at 12 noon.     ibuprofen 200 MG tablet  Commonly known as:  ADVIL,MOTRIN  Take 200 mg by mouth every 6 (six) hours as needed.  Reported on 12/05/2015     insulin detemir 100 UNIT/ML injection  Commonly known as:  LEVEMIR  Inject 24 Units into the skin daily.     insulin lispro 100 UNIT/ML injection  Commonly known as:  HUMALOG  Inject 4 Units into the skin 3 (three) times daily before meals. 4-8 units per sliding scale     lovastatin 10 MG tablet  Commonly known as:  MEVACOR  Take 10 mg by mouth at bedtime.     metoprolol tartrate 25 MG tablet  Commonly known as:  LOPRESSOR  Take 25 mg by mouth 2 (two) times daily. Reported  on 12/05/2015     multivitamin with minerals Tabs tablet  Take 1 tablet by mouth daily.     oxyCODONE 5 MG immediate release tablet  Commonly known as:  Oxy IR/ROXICODONE  Take 1-2 tablets (5-10 mg total) by mouth every 4 (four) hours as needed for breakthrough pain.     spironolactone 25 MG tablet  Commonly known as:  ALDACTONE  Take 25 mg by mouth daily.        Diagnostic Studies: Dg Hip Operative Unilat With Pelvis Right  12/05/2015  CLINICAL DATA:  Elective surgery.  Hip replacement. EXAM: OPERATIVE RIGHT HIP (WITH PELVIS IF PERFORMED) 1 VIEWS TECHNIQUE: Fluoroscopic spot image(s) were submitted for interpretation post-operatively. COMPARISON:  CT abdomen and pelvis 05/30/2012 FINDINGS: Two frontal projection intraoperative spot fluoroscopic images of the right hip are provided. The first demonstrates advanced osteoarthrosis of the right hip. The second demonstrates interval right total hip arthroplasty. The prosthetic components appear normally located on this single projection. The femoral stem was incompletely imaged. Matched from prior right inguinal hernia repair is noted. IMPRESSION: Fluoroscopic images during right total hip arthroplasty. Electronically Signed   By: Sebastian Ache M.D.   On: 12/05/2015 11:15   Dg Hip Unilat W Or W/o Pelvis 2-3 Views Right  12/05/2015  CLINICAL DATA:  Post op right hip EXAM: DG HIP (WITH OR WITHOUT PELVIS) 2-3V RIGHT COMPARISON:  None.  FINDINGS: Right hip arthroplasty. No acute hardware complication. No periprosthetic fracture. IMPRESSION: Expected appearance after right hip arthroplasty. Electronically Signed   By: Jeronimo Greaves M.D.   On: 12/05/2015 12:27    Disposition: 01-Home or Self Care      Discharge Instructions    Diet - low sodium heart healthy    Complete by:  As directed      Increase activity slowly    Complete by:  As directed            Follow-up Information    Follow up with MENZ,MICHAEL, MD In 2 weeks.   Specialty:  Orthopedic Surgery   Why:  Patient should already have an appointment scheduled.   Contact information:   [address] [place] [facility] [place] [address] [phone]        Signed: Tera Partridge 12/07/2015, 6:45 AM

## 2015-12-07 NOTE — Progress Notes (Signed)
Physical Therapy Treatment Patient Details Name: Ian [name] MRN: [id number] DOB: May 02, 1946 Today's Date: 12/07/2015    History of Present Illness Patient is a pleasant 69 y/o male that presents for R THR on 12/05/2015. He has a history of Parkinson's disease.     PT Comments    Pt has met all goals and is able to ambulate around RN station with safe technique. Pt very motivated to perform therapy. Pt only needs minimal assist to get in/out of bed, recommend sleeping in recliner at this time as pt states that is more comfortable. No stairs to enter/exit home. Good endurance with hep. Pt safe to dc home with family.  Follow Up Recommendations  Home health PT     Equipment Recommendations  Rolling walker with 5" wheels;3in1 (PT)    Recommendations for Other Services       Precautions / Restrictions Precautions Precautions: Fall;Anterior Hip Precaution Booklet Issued: Yes (comment) Restrictions Weight Bearing Restrictions: Yes RLE Weight Bearing: Weight bearing as tolerated    Mobility  Bed Mobility Overal bed mobility: Needs Assistance Bed Mobility: Supine to Sit           General bed mobility comments: bed mobility performed with cga. Safe technique performed with minimal assist for R LE.  Transfers Overall transfer level: Needs assistance Equipment used: Rolling walker (2 wheeled) Transfers: Sit to/from Stand Sit to Stand: Min guard         General transfer comment: safe technique performed with cues to push from seated surface  Ambulation/Gait Ambulation/Gait assistance: Min guard Ambulation Distance (Feet): 200 Feet Assistive device: Rolling walker (2 wheeled) Gait Pattern/deviations: Step-through pattern     General Gait Details: ambulated with reciprocal gait pattern demonstrating fluid movement with ability to keep rw moving. Pt with slightly forward flexed posture and very slow gait pattern, however no LOB noted.   Stairs             Wheelchair Mobility    Modified Rankin (Stroke Patients Only)       Balance                                    Cognition Arousal/Alertness: Awake/alert Behavior During Therapy: WFL for tasks assessed/performed Overall Cognitive Status: Within Functional Limits for tasks assessed                      Exercises Other Exercises Other Exercises: supine ther-ex performed x 12 reps including ankle pumps, glut sets, hip abd/add, and SAQ. All ther-ex performed x min assist. Other Exercises: Pt assisted to bathroom and used BSC. Min assist for clean up required and cga for set up. Positive BM noted.    General Comments        Pertinent Vitals/Pain Pain Assessment: 0-10 Pain Score: 3  Pain Location: R hip Pain Descriptors / Indicators: Aching Pain Intervention(s): Limited activity within patient's tolerance;Premedicated before session    Home Living                      Prior Function            PT Goals (current goals can now be found in the care plan section) Acute Rehab PT Goals Patient Stated Goal: To go home PT Goal Formulation: With patient/family Time For Goal Achievement: 12/19/15 Potential to Achieve Goals: Good Progress towards PT goals: Progressing toward goals  Frequency  BID    PT Plan Current plan remains appropriate    Co-evaluation             End of Session Equipment Utilized During Treatment: Gait belt Activity Tolerance: Patient tolerated treatment well Patient left: in chair;with chair alarm set;with family/visitor present     Time: 5053-9767 PT Time Calculation (min) (ACUTE ONLY): 41 min  Charges:  $Gait Training: 8-22 mins $Therapeutic Exercise: 8-22 mins $Therapeutic Activity: 8-22 mins                    G Codes:      Ian [name] 01-06-16, 11:01 AM  Ian [name], PT, DPT [phone]

## 2015-12-07 NOTE — Care Management (Signed)
Shower chair requested from Will with [facility]. I have sent Dr. Ernest PineHooten request for Rx. Lovenox $48.91. Patient and wife aware and agrees. They have picked [facility] for home health[facility] Patient [facility] home today. No other RNCM needs. Case closed.

## 2016-01-09 ENCOUNTER — Other Ambulatory Visit: Payer: Self-pay | Admitting: Vascular Surgery

## 2016-01-15 ENCOUNTER — Emergency Department: Payer: Medicare Other

## 2016-01-15 ENCOUNTER — Emergency Department
Admission: EM | Admit: 2016-01-15 | Discharge: 2016-01-15 | Disposition: A | Discharge status: Home / Self Care | Payer: Medicare Other | Attending: Emergency Medicine | Admitting: Emergency Medicine

## 2016-01-15 DIAGNOSIS — E119 Type 2 diabetes mellitus without complications: Secondary | ICD-10-CM | POA: Insufficient documentation

## 2016-01-15 DIAGNOSIS — I10 Essential (primary) hypertension: Secondary | ICD-10-CM | POA: Insufficient documentation

## 2016-01-15 DIAGNOSIS — Z794 Long term (current) use of insulin: Secondary | ICD-10-CM | POA: Insufficient documentation

## 2016-01-15 DIAGNOSIS — K529 Noninfective gastroenteritis and colitis, unspecified: Secondary | ICD-10-CM

## 2016-01-15 DIAGNOSIS — Z79899 Other long term (current) drug therapy: Secondary | ICD-10-CM | POA: Diagnosis not present

## 2016-01-15 DIAGNOSIS — Z7982 Long term (current) use of aspirin: Secondary | ICD-10-CM | POA: Diagnosis not present

## 2016-01-15 DIAGNOSIS — Z792 Long term (current) use of antibiotics: Secondary | ICD-10-CM | POA: Diagnosis not present

## 2016-01-15 DIAGNOSIS — K625 Hemorrhage of anus and rectum: Secondary | ICD-10-CM | POA: Diagnosis present

## 2016-01-15 LAB — CBC
HEMATOCRIT: 34.2 % — AB (ref 40.0–52.0)
Hemoglobin: 11.2 g/dL — ABNORMAL LOW (ref 13.0–18.0)
MCH: 28.7 pg (ref 26.0–34.0)
MCHC: 32.9 g/dL (ref 32.0–36.0)
MCV: 87.3 fL (ref 80.0–100.0)
PLATELETS: 178 10*3/uL (ref 150–440)
RBC: 3.92 MIL/uL — ABNORMAL LOW (ref 4.40–5.90)
RDW: 15.3 % — AB (ref 11.5–14.5)
WBC: 8.7 10*3/uL (ref 3.8–10.6)

## 2016-01-15 LAB — COMPREHENSIVE METABOLIC PANEL
ALBUMIN: 3.6 g/dL (ref 3.5–5.0)
ANION GAP: 5 (ref 5–15)
AST: 17 U/L (ref 15–41)
Alkaline Phosphatase: 74 U/L (ref 38–126)
BUN: 21 mg/dL — ABNORMAL HIGH (ref 6–20)
CHLORIDE: 105 mmol/L (ref 101–111)
CO2: 28 mmol/L (ref 22–32)
CREATININE: 0.74 mg/dL (ref 0.61–1.24)
Calcium: 8.6 mg/dL — ABNORMAL LOW (ref 8.9–10.3)
GFR calc non Af Amer: >60 mL/min (ref >60)
GLUCOSE: 182 mg/dL — AB (ref 65–99)
Potassium: 4.1 mmol/L (ref 3.5–5.1)
SODIUM: 138 mmol/L (ref 135–145)
Total Bilirubin: 0.6 mg/dL (ref 0.3–1.2)
Total Protein: 6.9 g/dL (ref 6.5–8.1)

## 2016-01-15 LAB — TYPE AND SCREEN
ABO/RH(D): O POS
ANTIBODY SCREEN: NEGATIVE

## 2016-01-15 MED ORDER — CIPROFLOXACIN HCL 500 MG PO TABS
500.0000 mg | ORAL_TABLET | Freq: Once | ORAL | Status: AC
  Administered 2016-01-15: 500 mg via ORAL

## 2016-01-15 MED ORDER — IOHEXOL 300 MG/ML  SOLN
100.0000 mL | Freq: Once | INTRAMUSCULAR | Status: AC | PRN
  Administered 2016-01-15: 100 mL via INTRAVENOUS

## 2016-01-15 MED ORDER — ACETAMINOPHEN 500 MG PO TABS: 1000.0000 mg | ORAL_TABLET | Freq: Three times a day (TID) | ORAL | Status: AC | PRN

## 2016-01-15 MED ORDER — METRONIDAZOLE 500 MG PO TABS
ORAL_TABLET | ORAL | Status: AC
  Filled 2016-01-15: qty 1

## 2016-01-15 MED ORDER — CIPROFLOXACIN HCL 500 MG PO TABS
ORAL_TABLET | ORAL | Status: AC
  Filled 2016-01-15: qty 1

## 2016-01-15 MED ORDER — METRONIDAZOLE 500 MG PO TABS
500.0000 mg | ORAL_TABLET | Freq: Three times a day (TID) | ORAL | Status: AC
Start: 2016-01-15 — End: 2016-01-22

## 2016-01-15 MED ORDER — CIPROFLOXACIN HCL 500 MG PO TABS: 500.0000 mg | ORAL_TABLET | Freq: Two times a day (BID) | ORAL | Status: AC

## 2016-01-15 MED ORDER — IOHEXOL 300 MG/ML  SOLN: 50.0000 mL | Freq: Once | INTRAMUSCULAR | Status: DC | PRN

## 2016-01-15 MED ORDER — METRONIDAZOLE 500 MG PO TABS
500.0000 mg | ORAL_TABLET | Freq: Once | ORAL | Status: AC
  Administered 2016-01-15: 500 mg via ORAL

## 2016-01-15 MED ORDER — IOHEXOL 240 MG/ML SOLN
25.0000 mL | Freq: Once | INTRAMUSCULAR | Status: AC | PRN
  Administered 2016-01-15: 25 mL via ORAL

## 2016-01-15 NOTE — ED Notes (Signed)
Pt returned from CT °

## 2016-01-15 NOTE — Discharge Instructions (Signed)
Please take your antibiotics as prescribed. Please follow up your primary care physician 1-2 days for recheck/reevaluation. Return to the emergency department for any worsening abdominal pain, increased bloody stools, or fever.   Colitis Colitis is inflammation of the colon. Colitis may last a short time (acute) or it may last a long time (chronic). CAUSES This condition may be caused by:  Viruses.  Bacteria.  Reactions to medicine.  Certain autoimmune diseases, such as Crohn disease or ulcerative colitis. SYMPTOMS Symptoms of this condition include:  Diarrhea.  Passing bloody or tarry stool.  Pain.  Fever.  Vomiting.  Tiredness (fatigue).  Weight loss.  Bloating.  Sudden increase in abdominal pain.  Having fewer bowel movements than usual. DIAGNOSIS This condition is diagnosed with a stool test or a blood test. You may also have other tests, including X-rays, a CT scan, or a colonoscopy. TREATMENT Treatment may include:  Resting the bowel. This involves not eating or drinking for a period of time.  Fluids that are given through an IV tube.  Medicine for pain and diarrhea.  Antibiotic medicines.  Cortisone medicines.  Surgery. HOME CARE INSTRUCTIONS Eating and Drinking  Follow instructions from your health care provider about eating or drinking restrictions.  Drink enough fluid to keep your urine clear or pale yellow.  Work with a dietitian to determine which foods cause your condition to flare up.  Avoid foods that cause flare-ups.  Eat a well-balanced diet. Medicines  Take over-the-counter and prescription medicines only as told by your health care provider.  If you were prescribed an antibiotic medicine, take it as told by your health care provider. Do not stop taking the antibiotic even if you start to feel better. General Instructions  Keep all follow-up visits as told by your health care provider. This is important. SEEK MEDICAL CARE  IF:  Your symptoms do not go away.  You develop new symptoms. SEEK IMMEDIATE MEDICAL CARE IF:  You have a fever that does not go away with treatment.  You develop chills.  You have extreme weakness, fainting, or dehydration.  You have repeated vomiting.  You develop severe pain in your abdomen.  You pass bloody or tarry stool.   This information is not intended to replace advice given to you by your health care provider. Make sure you discuss any questions you have with your health care provider.   Document Released: 01/02/2005 Document Revised: 08/16/2015 Document Reviewed: 03/20/2015 Elsevier Interactive Patient Education [employer]

## 2016-01-15 NOTE — ED Notes (Signed)
Pt reports that he had hip replacement in late December; he has been having pain in his bottom, presumably from being on it too much, for about 10 days. Pt noticed blood in his stool this morning. States that he had dark red blood per rectum this morning on the floor when he stood up, as well as in the toilet.

## 2016-01-15 NOTE — ED Notes (Signed)
Pt discharged home after verbalizing understanding of discharge instructions; nad noted. 

## 2016-01-15 NOTE — ED Provider Notes (Signed)
[facility] Emergency Department Provider Note  Time seen: 8:38 AM  I have reviewed the triage vital signs and the nursing notes.   HISTORY  Chief Complaint Rectal Bleeding    HPI Ian [name] is a 70 y.o. male with a past medical history of hypertension, Parkinson's, DVT, IVC filter, MI, who presents to the emergency department with a GI bleed. According to the patient yesterday he had several bowel movements that were darker in color. Today had a bowel movement with bright red blood. Has not had a bowel movement since overnight. States mild left lower quadrant abdominal pain. Dull aching quality. Denies fever. Patient takes a baby aspirin as his only anticoagulant. Denies any history of GI bleeding in the past.     Past Medical History  Diagnosis Date  . Hypertension   . Diabetes mellitus without complication (HCC)     Type 2, insulin dependent  . GERD (gastroesophageal reflux disease)   . Parkinson's disease (HCC)   . 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 Pacemaker   . History of atrial fibrillation   . History of CEA (carotid endarterectomy)     left side  . Coronary artery disease   . Myocardial infarction (HCC)   . Dysrhythmia   . Asthma   . Seasonal allergies   . Injury of leg, left     Lt leg reddened and draining on antibiotics  . Cellulitis     ? on Lt leg on antibiotics    Patient Active Problem List   Diagnosis Date Noted  . Primary osteoarthritis of right hip 12/05/2015  . Cardiomyopathy, ischemic 10/09/2014  . Obstructive apnea 11/13/2011  . Atrial flutter (HCC) 11/12/2011  . Arteriosclerosis of coronary artery 11/12/2011  . Artificial cardiac pacemaker 11/12/2011  . Colon polyp 11/12/2011  . CAFL (chronic airflow limitation) (HCC) 11/12/2011  . Diabetes (HCC) 11/12/2011  . Deep vein thrombosis of lower extremity (HCC) 11/12/2011  . HLD (hyperlipidemia) 11/12/2011  . AF (paroxysmal atrial fibrillation)  (HCC) 11/12/2011  . Esophagitis, reflux 11/12/2011  . Sick sinus syndrome (HCC) 11/12/2011  . Has a tremor 11/12/2011    Past Surgical History  Procedure Laterality Date  . Coronary artery bypass graft    . Vascular surgery    . Appendectomy    . Carotid endarterectomy Left   . Pacemaker insertion  2012    sss  . Hernia repair      UHR  . I&d extremity Left 01/04/2015    Procedure: IRRIGATION AND DEBRIDEMENT OF LOWER LEFT LEG WOUND WITH A CELL AND VAC;  Surgeon: Wayland Denis, DO;  Location: [facility];  Service: Plastics;  Laterality: Left;  . Application of a-cell of extremity Left 01/04/2015    Procedure: APPLICATION OF A-CELL  AND VAC ;  Surgeon: Wayland Denis, DO;  Location: [facility];  Service: Plastics;  Laterality: Left;  . Skin split graft Left 02/03/2015    Procedure: SKIN GRAFT FROM  LEFT THIGH TO LEFT LEG;  Surgeon: Glenna Fellows, MD;  Location: [facility];  Service: Plastics;  Laterality: Left;  . Cardiac catheterization    . Peripheral vascular catheterization N/A 11/06/2015    Procedure: IVC Filter Insertion;  Surgeon: Annice Needy, MD;  Location: [facility];  Service: Cardiovascular;  Laterality: N/A;  . Ivc filter placement ([facility] hx)    . Total hip arthroplasty Right 12/05/2015    Procedure:  TOTAL HIP ARTHROPLASTY ANTERIOR APPROACH;  Surgeon: Kennedy Bucker, MD;  Location: [facility];  Service: Orthopedics;  Laterality: Right;    Current Outpatient Rx  Name  Route  Sig  Dispense  Refill  . aspirin 81 MG tablet   Oral   Take 81 mg by mouth daily.         . budesonide-formoterol (SYMBICORT) 160-4.5 MCG/ACT inhaler   Inhalation   Inhale 2 puffs into the lungs 2 (two) times daily as needed.          . carbidopa-levodopa (SINEMET CR) 50-200 MG per tablet   Oral   Take 1 tablet by mouth at bedtime.         . carbidopa-levodopa (SINEMET IR) 25-100 MG per tablet   Oral   Take 2 tablets by mouth 3 (three)  times daily.          . cephALEXin (KEFLEX) 500 MG capsule   Oral   Take 500 mg by mouth 3 (three) times daily.         [name] enoxaparin (LOVENOX) 40 MG/0.4ML injection   Subcutaneous   Inject 0.4 mLs (40 mg total) into the skin daily.   14 Syringe   0   . esomeprazole (NEXIUM) 20 MG capsule   Oral   Take 20 mg by mouth daily at 12 noon.         [name] ibuprofen (ADVIL,MOTRIN) 200 MG tablet   Oral   Take 200 mg by mouth every 6 (six) hours as needed. Reported on 12/05/2015         . insulin detemir (LEVEMIR) 100 UNIT/ML injection   Subcutaneous   Inject 24 Units into the skin daily.          . insulin lispro (HUMALOG) 100 UNIT/ML injection   Subcutaneous   Inject 4 Units into the skin 3 (three) times daily before meals. 4-8 units per sliding scale         . lovastatin (MEVACOR) 10 MG tablet   Oral   Take 10 mg by mouth at bedtime.         . metoprolol tartrate (LOPRESSOR) 25 MG tablet   Oral   Take 25 mg by mouth 2 (two) times daily. Reported on 12/05/2015         . Multiple Vitamin (MULTIVITAMIN WITH MINERALS) TABS tablet   Oral   Take 1 tablet by mouth daily.         [name] oxyCODONE (OXY IR/ROXICODONE) 5 MG immediate release tablet   Oral   Take 1-2 tablets (5-10 mg total) by mouth every 4 (four) hours as needed for breakthrough pain.   80 tablet   0   . spironolactone (ALDACTONE) 25 MG tablet   Oral   Take 25 mg by mouth daily.           Allergies Lipitor and Zetia  No family history on file.  Social History Social History  Substance Use Topics  . Smoking status: Never Smoker   . Smokeless tobacco: Not on file  . Alcohol Use: No    Review of Systems Constitutional: Negative for fever. Cardiovascular: Negative for chest pain. Respiratory: Negative for shortness of breath. Gastrointestinal: Mild left lower quadrant abdominal pain. Negative for nausea or vomiting. Positive for dark loose stool. Genitourinary: Negative for  dysuria. Neurological: Negative for headache 10-point ROS otherwise negative.  ____________________________________________   PHYSICAL EXAM:  VITAL SIGNS: ED Triage Vitals  Enc Vitals Group     BP 01/15/16  0813 124/72 mmHg     Pulse Rate 01/15/16 0813 79     Resp 01/15/16 0813 18     Temp 01/15/16 0813 97.5 F (36.4 C)     Temp src --      SpO2 01/15/16 0813 97 %     Weight 01/15/16 0813 228 lb (103.42 kg)     Height 01/15/16 0813  (1.956 m)     Head Cir --      Peak Flow --      Pain Score --      Pain Loc --      Pain Edu? --      Excl. in GC? --     Constitutional: Alert and oriented. Well appearing and in no distress. Eyes: Normal exam ENT   Head: Normocephalic and atraumatic.   Mouth/Throat: Mucous membranes are moist. Cardiovascular: Normal rate, regular rhythm. No murmur Respiratory: Normal respiratory effort without tachypnea nor retractions. Breath sounds are clear  Gastrointestinal: Soft, mild left lower quadrant tenderness palpation. No rebound or guarding. No distention. Rectal exam: Nontender, no large hemorrhoids noted. No stool in rectal vault, however guaiac is positive on mucus. Musculoskeletal: Nontender with normal range of motion in all extremities. Neurologic:  Normal speech and language. No gross focal neurologic deficits Skin:  Skin is warm, dry and intact.  Psychiatric: Mood and affect are normal. Speech and behavior are normal.  ____________________________________________     RADIOLOGY  CT consistent with colitis  ____________________________________________    INITIAL IMPRESSION / ASSESSMENT AND PLAN / ED COURSE  Pertinent labs & imaging results that were available during my care of the patient were reviewed by me and considered in my medical decision making (see chart for details).  Patient presents with likely GI bleed. States mild left lower quadrant pain. On rectal exam patient has no stool in rectal vault but mucus  is strongly guaiac positive. We will proceed with a CT abdomen/pelvis to further evaluate. We will check labs and closely monitor in the emergency department.  CT shows colitis. It also shows a fluid collection near the right hip. This appears to be a resolving hematoma on examination. Patient states it used to be twice this headache but it is coming down. Nontender, no signs of infection or abscess. We'll place the patient on ciprofloxacin and Flagyl for colitis, have him follow-up his primary care physician 1-2 days for recheck/reevaluation. I discussed strict return precautions, they are agreeable. Patient has not had a bowel movement in the emergency department.  ____________________________________________   FINAL CLINICAL IMPRESSION(S) / ED DIAGNOSES  Colitis   Minna Antis, MD 01/15/16 1340

## 2016-01-15 NOTE — ED Notes (Signed)
Pt reports rectal bleeding starting this am. Pt noted bright red blood but also darker color in his stool. Pt denies hx of GI bleed or use of blood thinners.

## 2016-01-18 ENCOUNTER — Encounter
Admission: RE | Disposition: A | Discharge status: Home / Self Care | Payer: Self-pay | Source: Ambulatory Visit | Attending: Vascular Surgery

## 2016-01-18 ENCOUNTER — Encounter: Payer: Self-pay | Admitting: *Deleted

## 2016-01-18 ENCOUNTER — Ambulatory Visit
Admission: RE | Admit: 2016-01-18 | Discharge: 2016-01-18 | Disposition: A | Discharge status: Home / Self Care | Payer: Medicare Other | Source: Ambulatory Visit | Attending: Vascular Surgery | Admitting: Vascular Surgery

## 2016-01-18 DIAGNOSIS — K219 Gastro-esophageal reflux disease without esophagitis: Secondary | ICD-10-CM | POA: Diagnosis not present

## 2016-01-18 DIAGNOSIS — Z452 Encounter for adjustment and management of vascular access device: Secondary | ICD-10-CM | POA: Insufficient documentation

## 2016-01-18 DIAGNOSIS — Z86718 Personal history of other venous thrombosis and embolism: Secondary | ICD-10-CM | POA: Diagnosis not present

## 2016-01-18 DIAGNOSIS — E119 Type 2 diabetes mellitus without complications: Secondary | ICD-10-CM | POA: Diagnosis not present

## 2016-01-18 DIAGNOSIS — I6529 Occlusion and stenosis of unspecified carotid artery: Secondary | ICD-10-CM | POA: Diagnosis not present

## 2016-01-18 DIAGNOSIS — Z7982 Long term (current) use of aspirin: Secondary | ICD-10-CM | POA: Insufficient documentation

## 2016-01-18 DIAGNOSIS — G20 Parkinson's disease: Secondary | ICD-10-CM | POA: Diagnosis not present

## 2016-01-18 DIAGNOSIS — Z79899 Other long term (current) drug therapy: Secondary | ICD-10-CM | POA: Diagnosis not present

## 2016-01-18 DIAGNOSIS — Z794 Long term (current) use of insulin: Secondary | ICD-10-CM | POA: Diagnosis not present

## 2016-01-18 DIAGNOSIS — I4891 Unspecified atrial fibrillation: Secondary | ICD-10-CM | POA: Insufficient documentation

## 2016-01-18 HISTORY — PX: PERIPHERAL VASCULAR CATHETERIZATION: SHX172C

## 2016-01-18 SURGERY — IVC FILTER REMOVAL
Anesthesia: Moderate Sedation

## 2016-01-18 MED ORDER — HYDROMORPHONE HCL 1 MG/ML IJ SOLN: 1.0000 mg | Freq: Once | INTRAMUSCULAR | Status: DC

## 2016-01-18 MED ORDER — FENTANYL CITRATE (PF) 100 MCG/2ML IJ SOLN
INTRAMUSCULAR | Status: AC
  Filled 2016-01-18: qty 2

## 2016-01-18 MED ORDER — LIDOCAINE-EPINEPHRINE (PF) 1 %-1:200000 IJ SOLN
INTRAMUSCULAR | Status: AC
  Filled 2016-01-18: qty 30

## 2016-01-18 MED ORDER — MIDAZOLAM HCL 2 MG/2ML IJ SOLN
INTRAMUSCULAR | Status: DC | PRN
  Administered 2016-01-18: 1 mg via INTRAVENOUS
  Administered 2016-01-18: 2 mg via INTRAVENOUS

## 2016-01-18 MED ORDER — SODIUM CHLORIDE 0.9 % IV SOLN
INTRAVENOUS | Status: DC
  Administered 2016-01-18: 11:00:00 via INTRAVENOUS

## 2016-01-18 MED ORDER — ONDANSETRON HCL 4 MG/2ML IJ SOLN: 4.0000 mg | Freq: Four times a day (QID) | INTRAMUSCULAR | Status: DC | PRN

## 2016-01-18 MED ORDER — FENTANYL CITRATE (PF) 100 MCG/2ML IJ SOLN
INTRAMUSCULAR | Status: DC | PRN
  Administered 2016-01-18 (×2): 50 ug via INTRAVENOUS

## 2016-01-18 MED ORDER — DEXTROSE 5 % IV SOLN
INTRAVENOUS | Status: AC
  Filled 2016-01-18 (×34): qty 1.5

## 2016-01-18 MED ORDER — MIDAZOLAM HCL 5 MG/5ML IJ SOLN
INTRAMUSCULAR | Status: AC
Start: 2016-01-18 — End: 2016-01-18
  Filled 2016-01-18: qty 5

## 2016-01-18 MED ORDER — IOHEXOL 300 MG/ML  SOLN
INTRAMUSCULAR | Status: DC | PRN
  Administered 2016-01-18: 15 mL via INTRAVENOUS

## 2016-01-18 MED ORDER — LIDOCAINE-EPINEPHRINE (PF) 1 %-1:200000 IJ SOLN
INTRAMUSCULAR | Status: DC | PRN
  Administered 2016-01-18: 10 mL via INTRADERMAL

## 2016-01-18 MED ORDER — HEPARIN (PORCINE) IN NACL 2-0.9 UNIT/ML-% IJ SOLN
INTRAMUSCULAR | Status: AC
  Filled 2016-01-18: qty 500

## 2016-01-18 MED ORDER — CEFUROXIME SODIUM 1.5 G IJ SOLR
1.5000 g | INTRAMUSCULAR | Status: AC
  Administered 2016-01-18: 1.5 g via INTRAVENOUS

## 2016-01-18 SURGICAL SUPPLY — 4 items
PACK ANGIOGRAPHY (CUSTOM PROCEDURE TRAY) ×2 IMPLANT
SET VENACAVA FILTER RETRIEVAL (MISCELLANEOUS) ×2 IMPLANT
TOWEL OR 17X26 4PK STRL BLUE (TOWEL DISPOSABLE) ×2 IMPLANT
WIRE J 3MM .035X145CM (WIRE) ×2 IMPLANT

## 2016-01-18 NOTE — Op Note (Signed)
    OPERATIVE NOTE   PROCEDURE: 1. Ultrasound guidance for vascular access to right jugular vein. 2. Catheter placement into IVC from right jugular vein. 3. Inferior venacavogram. 4. Retrieval of IVC filter.  PRE-OPERATIVE DIAGNOSIS: 1. Status post IVC filter for previous DVT    POST-OPERATIVE DIAGNOSIS: Same as above  SURGEON: Festus Barren, MD  ASSISTANT(S): None  ANESTHESIA: local with Moderate Conscious Sedation for approximately 15 minutes using 3 mg of Versed and 100 mcg of Fentanyl  ESTIMATED BLOOD LOSS: minimal  CONTRAST: 15 cc  FLUORO TIME: 0.6 minutes  FINDING(S): 1. Patent IVC  SPECIMEN(S): Filter retrieved and disposed of  INDICATIONS:  Patient is a 70 y.o.male who presents with a previous history of IVC filter placement for previous DVT and recent hip replacement.  The patient desires removal of the filter to avoid the small lifetime risks of perforation, infection, thrombosis, migration, and stent fracture.  Risks and benefits of removal were discussed and the patient is agreeable to proceed.  The patient understands that the filter may not be able to be retrieved if the top of the filter has embedded in the caval wall.   DESCRIPTION: After obtaining full informed written consent, the patient was brought back to the operating room and placed supine upon the operating table. After obtaining adequate sedation, the patient was prepped and draped in the standard fashion. Moderate conscious sedation was administered during the face to face encounter with the patient throughout the procedure with my supervision of the RN administering medicines and monitoring the patient's vital signs, pulse oximetry, telemetry and mental status throughout from the start of the procedure until the patient was taken to the recovery room.  The right jugular vein was visualized with ultrasound and found to be widely patent. This was accessed under direct ultrasound guidance with a Seldinger  needle and a permanent image was recorded. A J-wire was then placed. After skin nick and dilatation, the retrieval sheath was placed over the wire into the inferior vena cava. Inferior venacavogram was then performed. The IVC was found to be widely patent and the filter was in good location. I then was able to snare the hook on the top of the filter and advanced the sheath over the filter collapsing it and bringing it into the sheath in its entirety. It was then removed through the sheath in its entirety. The sheath was then removed and pressure was held on the neck. The patient tolerated the procedure well and was taken to the recovery room in stable condition.  COMPLICATIONS: None  CONDITION: Stable   Lando Alcalde 01/18/2016 12:21 PM

## 2016-01-18 NOTE — Discharge Instructions (Signed)

## 2016-01-18 NOTE — H&P (Signed)
  [facility] History & Physical Update  The patient was interviewed and re-examined.  The patient's previous History and Physical has been reviewed and is unchanged.  There is no change in the plan of care. We plan to proceed with the scheduled procedure.  DEW,JASON, MD  01/18/2016, 11:25 AM

## 2016-01-19 ENCOUNTER — Encounter: Payer: Self-pay | Admitting: Vascular Surgery

## 2016-01-22 NOTE — H&P (Signed)
[facility] Admission History & Physical  MRN : [id number]  Ian [name] is a 70 y.o. (1946-11-09) male who presents with chief complaint of No chief complaint on file. Ian [name]  History of Present Illness: Patient is a 70 year old male who presents for removal of his IVC filter. He had this placed recently due to strong history of DVT and a major hip replacement surgery about a month or 6 weeks ago. His duplex after the procedure shows no evidence of DVT. He has no other complaints and has been recovering well after his hip surgery.  No current facility-administered medications for this encounter.   Current Outpatient Prescriptions  Medication Sig Dispense Refill  . acetaminophen (TYLENOL) 500 MG tablet Take 2 tablets (1,000 mg total) by mouth every 8 (eight) hours as needed for mild pain. 30 tablet 0  . aspirin 81 MG tablet Take 81 mg by mouth daily.    . carbidopa-levodopa (SINEMET CR) 50-200 MG per tablet Take 1 tablet by mouth at bedtime.    . carbidopa-levodopa (SINEMET IR) 25-100 MG per tablet Take 2 tablets by mouth 3 (three) times daily.     . ciprofloxacin (CIPRO) 500 MG tablet Take 1 tablet (500 mg total) by mouth 2 (two) times daily. 28 tablet 0  . esomeprazole (NEXIUM) 20 MG capsule Take 20 mg by mouth daily at 12 noon.    Ian [name] ibuprofen (ADVIL,MOTRIN) 200 MG tablet Take 200 mg by mouth every 6 (six) hours as needed. Reported on 12/05/2015    . insulin detemir (LEVEMIR) 100 UNIT/ML injection Inject 24 Units into the skin daily.     . insulin lispro (HUMALOG) 100 UNIT/ML injection Inject 4 Units into the skin 3 (three) times daily before meals. 4-8 units per sliding scale    . lovastatin (MEVACOR) 10 MG tablet Take 10 mg by mouth at bedtime.    . metoprolol tartrate (LOPRESSOR) 25 MG tablet Take 25 mg by mouth 2 (two) times daily. Reported on 12/05/2015    . metroNIDAZOLE (FLAGYL) 500 MG tablet Take 1 tablet (500 mg total) by mouth 3 (three) times daily. 42  tablet 0  . Multiple Vitamin (MULTIVITAMIN WITH MINERALS) TABS tablet Take 1 tablet by mouth daily.    Ian [name] spironolactone (ALDACTONE) 25 MG tablet Take 25 mg by mouth daily.      Past Medical History  Diagnosis Date  . Diabetes mellitus without complication (HCC)     Type 2, insulin dependent  . GERD (gastroesophageal reflux disease)   . Parkinson's disease (HCC)   . 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 Pacemaker   . History of atrial fibrillation   . History of CEA (carotid endarterectomy)     left side  . Coronary artery disease   . Myocardial infarction (HCC)   . Dysrhythmia   . Asthma   . Seasonal allergies   . Injury of leg, left     Lt leg reddened and draining on antibiotics  . Cellulitis     ? on Lt leg on antibiotics  . Hypertension     Past Surgical History  Procedure Laterality Date  . Coronary artery bypass graft    . Vascular surgery    . Appendectomy    . Carotid endarterectomy Left   . Pacemaker insertion  2012    sss  . Hernia repair      UHR  . I&d extremity Left 01/04/2015    Procedure: IRRIGATION  AND DEBRIDEMENT OF LOWER LEFT LEG WOUND WITH A CELL AND VAC;  Surgeon: Wayland Denis, DO;  Location: [facility];  Service: Plastics;  Laterality: Left;  . Application of a-cell of extremity Left 01/04/2015    Procedure: APPLICATION OF A-CELL  AND VAC ;  Surgeon: Wayland Denis, DO;  Location: [facility];  Service: Plastics;  Laterality: Left;  . Skin split graft Left 02/03/2015    Procedure: SKIN GRAFT FROM  LEFT THIGH TO LEFT LEG;  Surgeon: Glenna Fellows, MD;  Location: [facility];  Service: Plastics;  Laterality: Left;  . Cardiac catheterization    . Peripheral vascular catheterization N/A 11/06/2015    Procedure: IVC Filter Insertion;  Surgeon: Annice Needy, MD;  Location: [facility];  Service: Cardiovascular;  Laterality: N/A;  . Ivc filter placement ([facility] hx)    . Total hip  arthroplasty Right 12/05/2015    Procedure: TOTAL HIP ARTHROPLASTY ANTERIOR APPROACH;  Surgeon: Kennedy Bucker, MD;  Location: [facility];  Service: Orthopedics;  Laterality: Right;  . Peripheral vascular catheterization N/A 01/18/2016    Procedure: IVC Filter Removal;  Surgeon: Annice Needy, MD;  Location: [facility];  Service: Cardiovascular;  Laterality: N/A;    Social History Social History  Substance Use Topics  . Smoking status: Never Smoker   . Smokeless tobacco: None  . Alcohol Use: No  No IVDU. Married, lives with his wife  Family History No bleeding disorders, clotting disorders, or aneurysms  Allergies  Allergen Reactions  . Lipitor [Atorvastatin] Other (See Comments)    Muscle aches  . Zetia [Ezetimibe]      REVIEW OF SYSTEMS (Negative unless checked)  Constitutional: [] Weight loss  [] Fever  [] Chills Cardiac: [] Chest pain   [] Chest pressure   [] Palpitations   [] Shortness of breath when laying flat   [] Shortness of breath at rest   [] Shortness of breath with exertion. Vascular:  [] Pain in legs with walking   [] Pain in legs at rest   [] Pain in legs when laying flat   [] Claudication   [] Pain in feet when walking  [] Pain in feet at rest  [] Pain in feet when laying flat   [x] History of DVT   [] Phlebitis   [] Swelling in legs   [] Varicose veins   [] Non-healing ulcers Pulmonary:   [] Uses home oxygen   [] Productive cough   [] Hemoptysis   [] Wheeze  [] COPD   [] Asthma Neurologic:  [] Dizziness  [] Blackouts   [] Seizures   [] History of stroke   [] History of TIA  [] Aphasia   [] Temporary blindness   [] Dysphagia   [] Weakness or numbness in arms   [] Weakness or numbness in legs Musculoskeletal:  [] Arthritis   [] Joint swelling   [] Joint pain   [] Low back pain Hematologic:  [] Easy bruising  [] Easy bleeding   [] Hypercoagulable state   [] Anemic  [] Hepatitis Gastrointestinal:  [] Blood in stool   [] Vomiting blood  [] Gastroesophageal reflux/heartburn   [] Difficulty swallowing. Genitourinary:   [] Chronic kidney disease   [] Difficult urination  [] Frequent urination  [] Burning with urination   [] Blood in urine Skin:  [] Rashes   [] Ulcers   [] Wounds Psychological:  [] History of anxiety   []  History of major depression.  Physical Examination  Filed Vitals:   01/18/16 1024 01/18/16 1215 01/18/16 1230 01/18/16 1245  BP:  123/72 112/92 113/88  Pulse:  75 74 75  Temp: 97.7 F (36.5 C)     TempSrc: Oral     Resp:  20 22 15  Height:  (1.956 m)     Weight: 103.42 kg (228 lb)     SpO2:  98% 98% 98%   Body mass index is 27.03 kg/(m^2). Gen: WD/WN, NAD Head: [place]/AT, No temporalis wasting. Prominent temp pulse not noted. Ear/Nose/Throat: Hearing grossly intact, nares w/o erythema or drainage, oropharynx w/o Erythema/Exudate,  Eyes: PERRLA, EOMI.  Neck: Supple, no nuchal rigidity.  No bruit or JVD.  Pulmonary:  Good air movement, clear to auscultation bilaterally, no use of accessory muscles.  Cardiac: RRR, normal S1, S2, no Murmurs, rubs or gallops. Vascular:  Vessel Right Left  Radial Palpable Palpable  Ulnar Palpable Palpable  Brachial Palpable Palpable  Carotid Palpable, without bruit Palpable, without bruit  Aorta Not palpable N/A  Femoral Palpable Palpable  Popliteal Palpable Palpable  PT Palpable Palpable  DP Palpable Palpable   Gastrointestinal: soft, non-tender/non-distended. No guarding/reflex.  Musculoskeletal: M/S 5/5 throughout.  Extremities without ischemic changes.  No deformity or atrophy.  Neurologic: CN 2-12 intact. Pain and light touch intact in extremities.  Symmetrical.  Speech is fluent. Motor exam as listed above. Tremor present Psychiatric: Judgment intact, Mood & affect appropriate for pt's clinical situation. Dermatologic: No rashes or ulcers noted.  No cellulitis or open wounds. Lymph : No Cervical, Axillary, or Inguinal lymphadenopathy.      CBC Lab Results  Component Value Date   WBC 8.7 01/15/2016   HGB 11.2* 01/15/2016   HCT 34.2*  01/15/2016   MCV 87.3 01/15/2016   PLT 178 01/15/2016    BMET    Component Value Date/Time   NA 138 01/15/2016 0901   NA 136 01/06/2014 0502   K 4.1 01/15/2016 0901   K 3.8 01/06/2014 0502   CL 105 01/15/2016 0901   CL 105 01/06/2014 0502   CO2 28 01/15/2016 0901   CO2 28 01/06/2014 0502   GLUCOSE 182* 01/15/2016 0901   GLUCOSE 147* 01/06/2014 0502   BUN 21* 01/15/2016 0901   BUN 12 01/06/2014 0502   CREATININE 0.74 01/15/2016 0901   CREATININE 0.91 01/06/2014 0502   CALCIUM 8.6* 01/15/2016 0901   CALCIUM 7.9* 01/06/2014 0502   GFRNONAA >60 01/15/2016 0901   GFRNONAA >60 01/06/2014 0502   GFRNONAA 56* 01/31/2012 1203   GFRAA >60 01/15/2016 0901   GFRAA >60 01/06/2014 0502   GFRAA >60 01/31/2012 1203   Estimated Creatinine Clearance: 111.4 mL/min (by C-G formula based on Cr of 0.74).  COAG Lab Results  Component Value Date   INR 1.06 11/24/2015   INR 1.1 01/06/2014    Radiology Ct Abdomen Pelvis W Contrast  01/15/2016  CLINICAL DATA:  Lower abdominal pain and gastrointestinal bleeding EXAM: CT ABDOMEN AND PELVIS WITH CONTRAST TECHNIQUE: Multidetector CT imaging of the abdomen and pelvis was performed using the standard protocol following bolus administration of intravenous contrast. Oral contrast was also administered. CONTRAST:  OMNIPAQUE IOHEXOL 300 MG/ML  SOLN COMPARISON:  May 30, 2012 FINDINGS: Lower chest: There is fibrosis in the lung bases, more notable on the right than on the left. There are foci of calcification along the parietal pleura on the right consistent with prior asbestos exposure. There is right posterior basilar pleural thickening as well. Pacemaker leads are attached to the right atrium and right ventricle. There is no lung base edema or consolidation. There is a small hiatal hernia. Hepatobiliary: There is a cyst in the dome of the liver on the right anteriorly measuring 1.6 x 1.6 cm, stable. No new liver lesions are identified. There  is  cholelithiasis. Gallbladder wall is not thickened. There is no biliary duct dilatation. Pancreas: No pancreatic mass or inflammatory focus. Spleen: No splenic lesions are identified. Adrenals/Urinary Tract: No adrenal lesions are identified. There is a tiny cyst in the periphery of the mid left kidney. There is a cyst in the lower pole of the left kidney measuring 1.3 x 1.3 cm. There is no hydronephrosis on either side. There is no renal or ureteral calculus on either side. Urinary bladder is midline with wall thickness within normal limits. Stomach/Bowel: There is localized wall thickening in the area of the distal transverse colon and splenic flexure. There is minimal adjacent mesenteric thickening in this area. There is no appreciable diverticular disease in this area. No perforation or abscess seen in this area. There is no other bowel wall or mesenteric thickening. No bowel obstruction. No free air or portal venous air. Vascular/Lymphatic: There is a filter in the inferior vena cava. There is atherosclerotic calcification in the aorta with peripheral thrombus. No aneurysm is seen. The major mesenteric vessels appear patent with only modest calcification in the proximal major mesenteric vessels. There is no evidence of adenopathy in the abdomen or pelvis. Reproductive: Prostate and seminal vesicles appear unremarkable. Within the pelvis there is no mass or fluid collection. Other: The appendix is absent. No abscess or ascites is seen within the abdomen or pelvis. There is, however, a fluid-filled structure anterior to the tensor fascia lata muscle on the right measuring 5.2 x 3.1 cm. No air is seen in this collection. Musculoskeletal: Patient is status post total hip replacement on the right. There is degenerative change in the lumbar spine and sacroiliac joints. A benign cystic area is seen just lateral to the right sacroiliac joint anteriorly, stable. No intramuscular lesions are identified. IMPRESSION: There  is a localized area of wall thickening and minimal mesenteric thickening involving the distal transverse colon and splenic flexure, consistent with focal colitis. There is no pneumatosis in this area or bowel obstruction. No wall thickening is seen elsewhere. There is a fluid-filled structure anterior to the right tensor fascia lata muscle measuring 5.2 x 3.1 cm. The patient has had a recent total hip replacement on the right. Question hematoma versus possible abscess in this area. No areas seen within this fluid collection anterior to the right tensor fascia lata muscle. Evidence of prior asbestos exposure with fibrosis in the lung bases, more on the right than on the left, as well as parietal pleural calcification and pleural thickening on the right posteriorly. Appendix absent.  No periappendiceal region inflammation. Within the intraperitoneal and retroperitoneal regions, no abscess is seen. No ascites. No renal or ureteral calculi.  No hydronephrosis. Filter in inferior vena cava. Apex of the filter is directed superiorly with the tip of the filter inferior to the renal veins. Small hiatal hernia. Electronically Signed   By: Bretta Bang III M.D.   On: 01/15/2016 11:43     Assessment/Plan 1. S/p IVC filter.  For removal. Risks and benefits discussed 2. S/p DVT previously.  None now.  Recovered from his hip replacement 3. Carotid stenosis. S/p CEA.  Doing well. No new symptoms 4. Parkinson's. Stable. On oral medications.   DEW,JASON, MD  01/22/2016 11:41 AM

## 2016-02-25 IMAGING — CT CT ABD-PELV W/ CM
2 of 5 series · 14 of 46 positions shown, 16 images · IV contrast (omnipaque)
Comparison: May 30, 2012

CLINICAL DATA: Lower abdominal pain and gastrointestinal bleeding

EXAM:
CT ABDOMEN AND PELVIS WITH CONTRAST
TECHNIQUE: Multidetector CT imaging of the abdomen and pelvis was performed
using the standard protocol following bolus administration of
intravenous contrast. Oral contrast was also administered.
CONTRAST:  100mL OMNIPAQUE IOHEXOL 300 MG/ML  SOLN

[Series 2: routine abd pel with · axial · 0.73mm/px · z∈[-572,-57]mm · 11 of 115 slices shown, 13 images]
[im 6/115  soft-tissue]
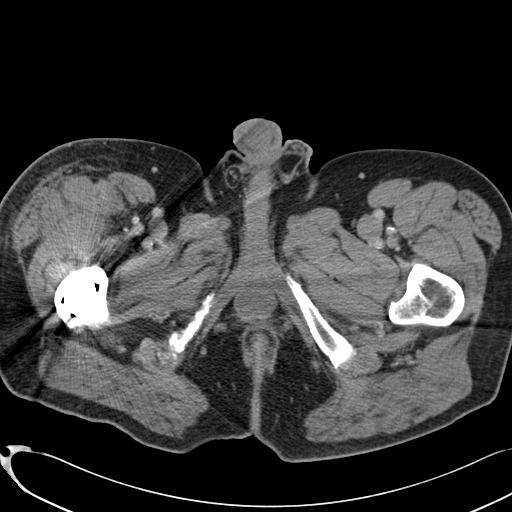
[im 6/115  bone]
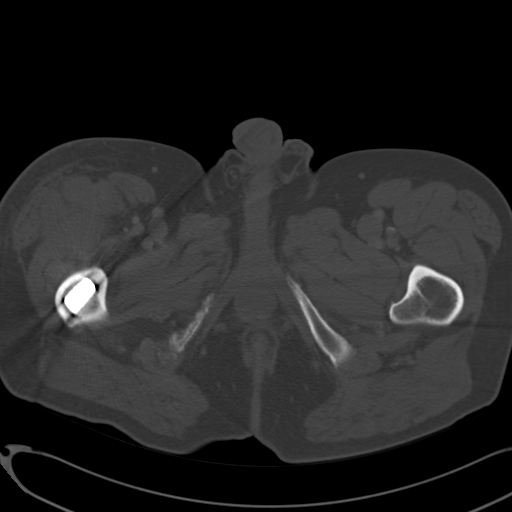
[im 17/115  soft-tissue]
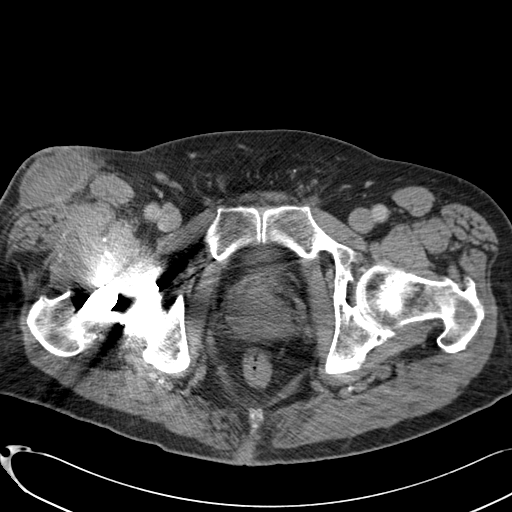
[im 28/115  soft-tissue]
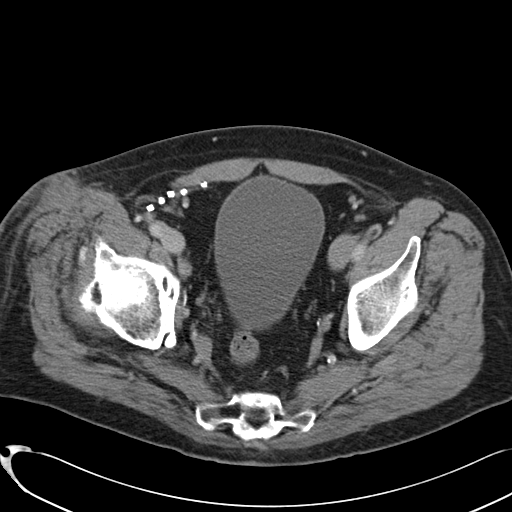
[im 39/115  soft-tissue]
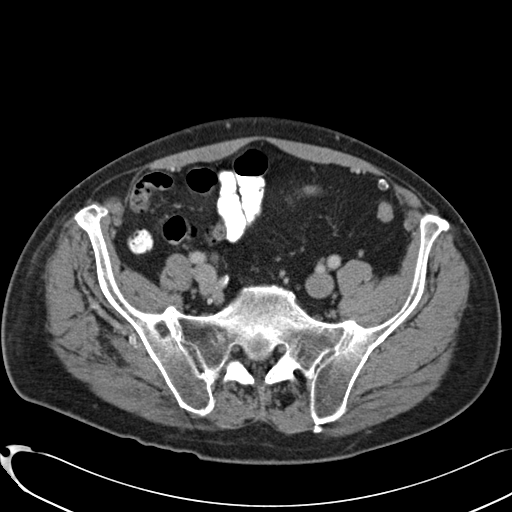
[im 49/115  soft-tissue]
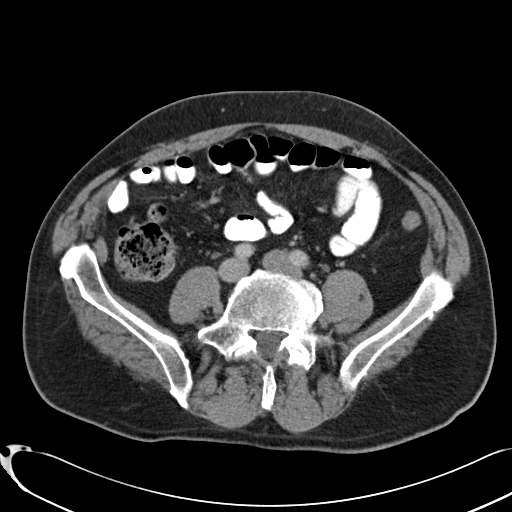
[im 60/115  soft-tissue]
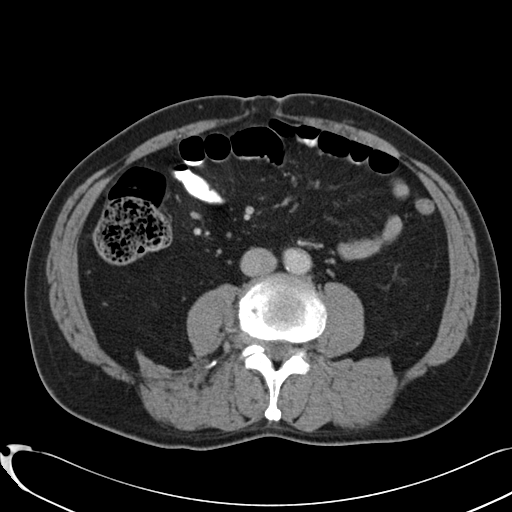
[im 66/115  soft-tissue]
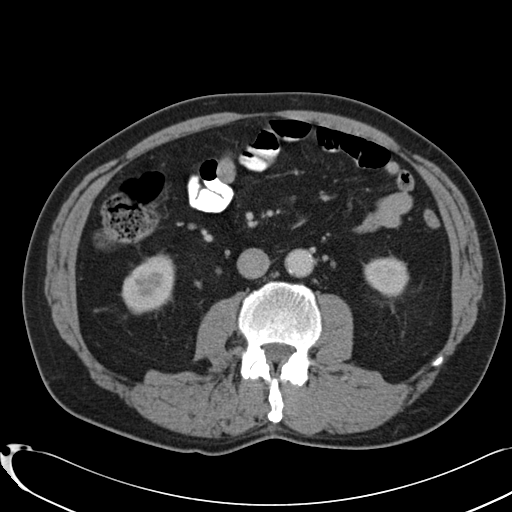
[im 77/115  soft-tissue]
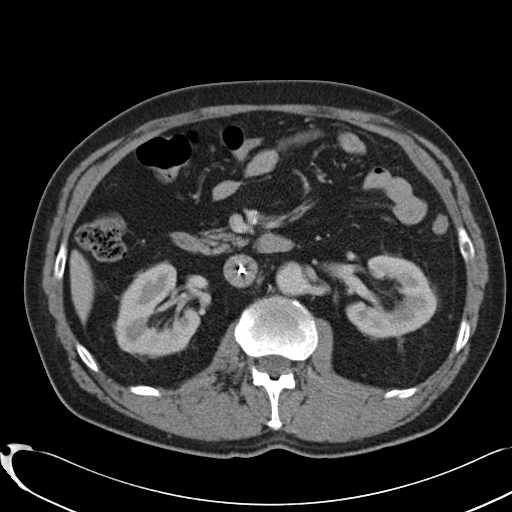
[im 87/115  soft-tissue]
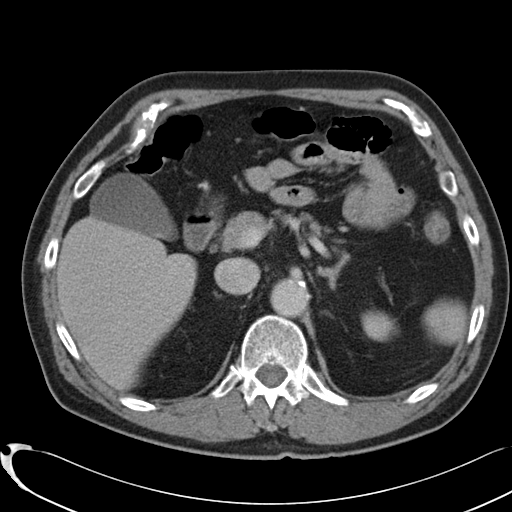
[im 87/115  bone]
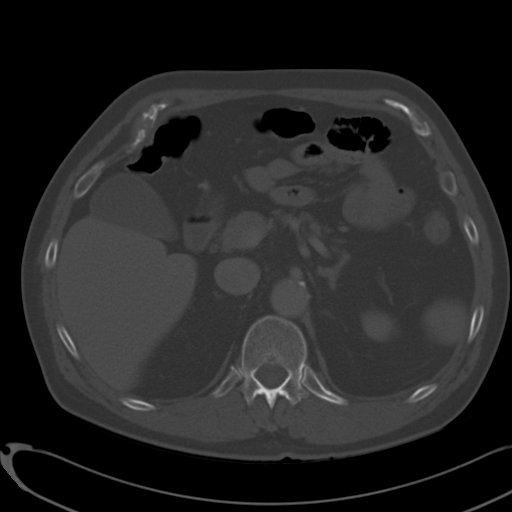
[im 98/115  soft-tissue]
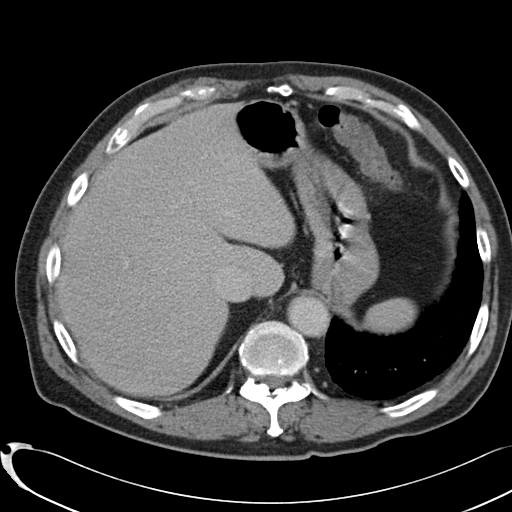
[im 109/115  soft-tissue]
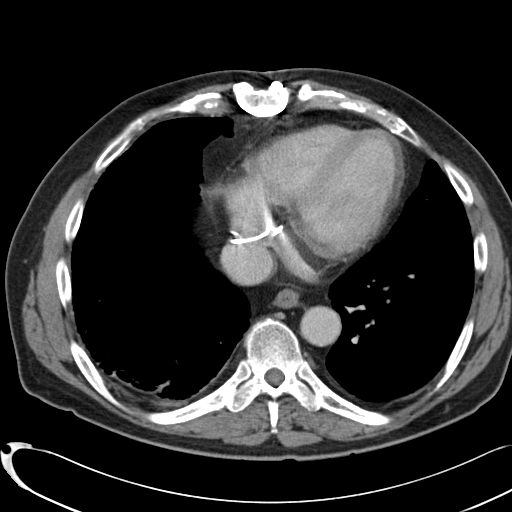

[Series 5: cor routine abd pel with · coronal · 0.78mm/px · 3 of 146 slices shown]
[im 49/146  soft-tissue]
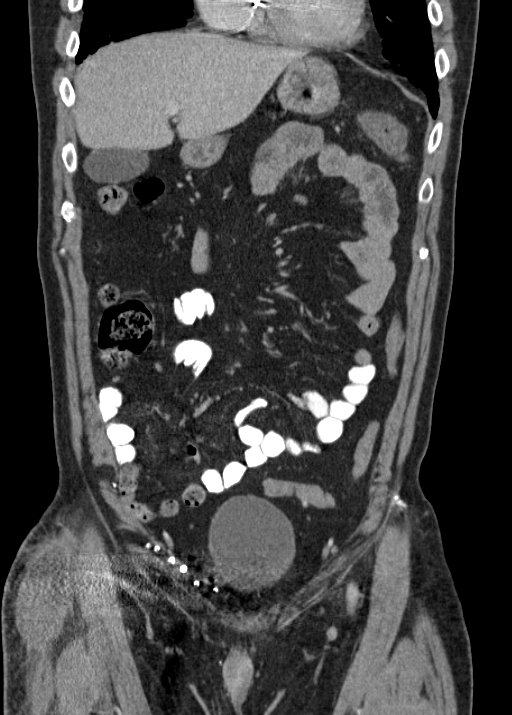
[im 65/146  soft-tissue]
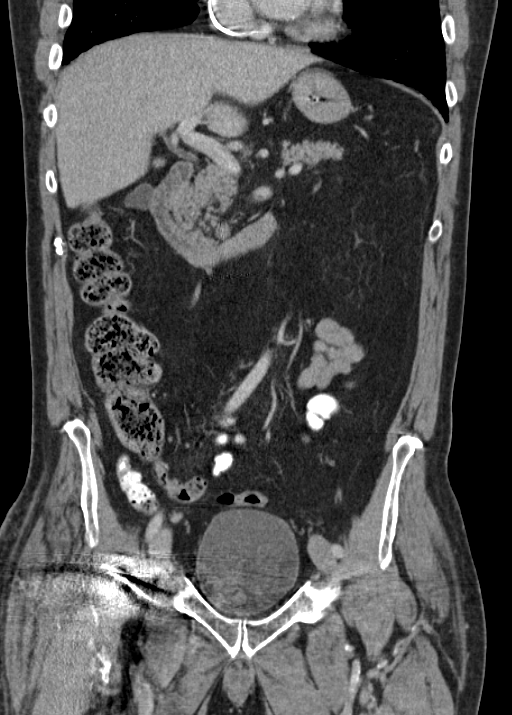
[im 81/146  soft-tissue]
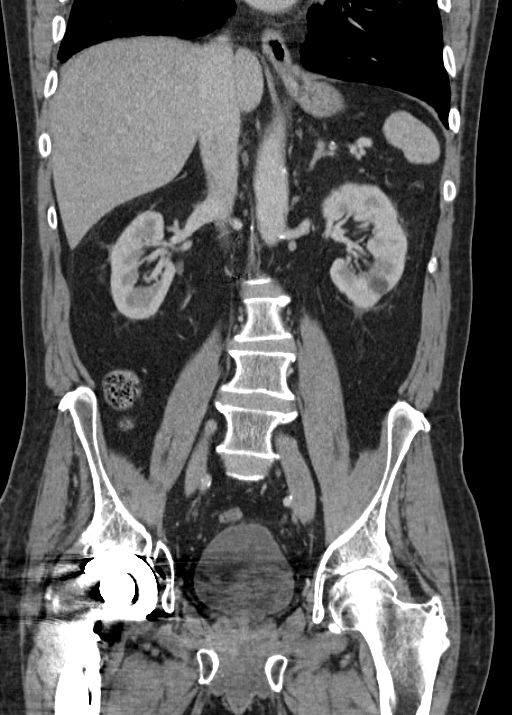

[14 of 46 positions shown; findings below may reference images not displayed]

FINDINGS: Lower chest: There is fibrosis in the lung bases, more notable on
the right than on the left. There are foci of calcification along
the parietal pleura on the right consistent with prior asbestos
exposure. There is right posterior basilar pleural thickening as
well. Pacemaker leads are attached to the right atrium and right
ventricle. There is no lung base edema or consolidation. There is a
small hiatal hernia.

Hepatobiliary: There is a cyst in the dome of the liver on the right
anteriorly measuring 1.6 x 1.6 cm, stable. No new liver lesions are
identified. There is cholelithiasis. Gallbladder wall is not
thickened. There is no biliary duct dilatation.

Pancreas: No pancreatic mass or inflammatory focus.

Spleen: No splenic lesions are identified.

Adrenals/Urinary Tract: No adrenal lesions are identified. There is
a tiny cyst in the periphery of the mid left kidney. There is a cyst
in the lower pole of the left kidney measuring 1.3 x 1.3 cm. There
is no hydronephrosis on either side. There is no renal or ureteral
calculus on either side. Urinary bladder is midline with wall
thickness within normal limits.

Stomach/Bowel: There is localized wall thickening in the area of the
distal transverse colon and splenic flexure. There is minimal
adjacent mesenteric thickening in this area. There is no appreciable
diverticular disease in this area. No perforation or abscess seen in
this area. There is no other bowel wall or mesenteric thickening. No
bowel obstruction. No free air or portal venous air.

Vascular/Lymphatic: There is a filter in the inferior vena cava.
There is atherosclerotic calcification in the aorta with peripheral
thrombus. No aneurysm is seen. The major mesenteric vessels appear
patent with only modest calcification in the proximal major
mesenteric vessels. There is no evidence of adenopathy in the
abdomen or pelvis.

Reproductive: Prostate and seminal vesicles appear unremarkable.
Within the pelvis there is no mass or fluid collection.

Other: The appendix is absent. No abscess or ascites is seen within
the abdomen or pelvis. There is, however, a fluid-filled structure
anterior to the tensor fascia lata muscle on the right measuring
x 3.1 cm. No air is seen in this collection.

Musculoskeletal: Patient is status post total hip replacement on the
right. There is degenerative change in the lumbar spine and
sacroiliac joints. A benign cystic area is seen just lateral to the
right sacroiliac joint anteriorly, stable. No intramuscular lesions
are identified.
IMPRESSION: There is a localized area of wall thickening and minimal mesenteric
thickening involving the distal transverse colon and splenic
flexure, consistent with focal colitis. There is no pneumatosis in
this area or bowel obstruction. No wall thickening is seen
elsewhere.

There is a fluid-filled structure anterior to the right tensor
fascia lata muscle measuring 5.2 x 3.1 cm. The patient has had a
recent total hip replacement on the right. Question hematoma versus
possible abscess in this area. No areas seen within this fluid
collection anterior to the right tensor fascia lata muscle.

Evidence of prior asbestos exposure with fibrosis in the lung bases,
more on the right than on the left, as well as parietal pleural
calcification and pleural thickening on the right posteriorly.

Appendix absent.  No periappendiceal region inflammation.

Within the intraperitoneal and retroperitoneal regions, no abscess
is seen. No ascites.

No renal or ureteral calculi.  No hydronephrosis.

Filter in inferior vena cava. Apex of the filter is directed
superiorly with the tip of the filter inferior to the renal veins.

Small hiatal hernia.

## 2017-01-07 ENCOUNTER — Encounter (INDEPENDENT_AMBULATORY_CARE_PROVIDER_SITE_OTHER): Payer: Medicare Other

## 2017-01-07 ENCOUNTER — Ambulatory Visit (INDEPENDENT_AMBULATORY_CARE_PROVIDER_SITE_OTHER): Payer: Self-pay | Admitting: Vascular Surgery

## 2017-06-02 ENCOUNTER — Other Ambulatory Visit (INDEPENDENT_AMBULATORY_CARE_PROVIDER_SITE_OTHER): Payer: Self-pay | Admitting: Vascular Surgery

## 2017-06-02 DIAGNOSIS — I779 Disorder of arteries and arterioles, unspecified: Secondary | ICD-10-CM

## 2017-06-02 DIAGNOSIS — I739 Peripheral vascular disease, unspecified: Principal | ICD-10-CM

## 2017-06-03 ENCOUNTER — Ambulatory Visit (INDEPENDENT_AMBULATORY_CARE_PROVIDER_SITE_OTHER): Payer: Medicare Other

## 2017-06-03 ENCOUNTER — Encounter (INDEPENDENT_AMBULATORY_CARE_PROVIDER_SITE_OTHER): Payer: Self-pay | Admitting: Vascular Surgery

## 2017-06-03 ENCOUNTER — Ambulatory Visit (INDEPENDENT_AMBULATORY_CARE_PROVIDER_SITE_OTHER): Payer: Medicare Other | Admitting: Vascular Surgery

## 2017-06-03 VITALS — BP 131/81 | HR 83 | Resp 16 | Wt 237.8 lb

## 2017-06-03 DIAGNOSIS — E785 Hyperlipidemia, unspecified: Secondary | ICD-10-CM | POA: Diagnosis not present

## 2017-06-03 DIAGNOSIS — E118 Type 2 diabetes mellitus with unspecified complications: Secondary | ICD-10-CM | POA: Diagnosis not present

## 2017-06-03 DIAGNOSIS — I779 Disorder of arteries and arterioles, unspecified: Secondary | ICD-10-CM

## 2017-06-03 DIAGNOSIS — I6523 Occlusion and stenosis of bilateral carotid arteries: Secondary | ICD-10-CM | POA: Diagnosis not present

## 2017-06-03 DIAGNOSIS — I739 Peripheral vascular disease, unspecified: Principal | ICD-10-CM

## 2017-06-03 NOTE — Progress Notes (Signed)
Subjective:    Patient ID: Ian [name], male    DOB: 1946/01/30, 71 y.o.   MRN: [id number] Chief Complaint  Patient presents with  . Follow-up   Patient presents for a yearly non-invasive study follow up for carotid stenosis. The stenosis has been followed by surveillance duplexes. The patient underwent a bilateral carotid duplex scan which showed no change from the previous exam on 01/05/2016. Duplex is stable with Right ICA stenosis (<50%) and patent left carotid artery endarterectomy without evidence of restenosis. The patient denies experiencing Amaurosis Fugax, TIA like symptoms or focal motor deficits.   Review of Systems  Constitutional: Negative.   HENT: Negative.   Eyes: Negative.   Respiratory: Negative.   Cardiovascular: Negative.   Gastrointestinal: Negative.   Endocrine: Negative.   Genitourinary: Negative.   Musculoskeletal: Negative.   Skin: Negative.   Allergic/Immunologic: Negative.   Neurological: Negative.   Hematological: Negative.   Psychiatric/Behavioral: Negative.       Objective:   Physical Exam  Constitutional: He is oriented to person, place, and time. He appears well-developed and well-nourished. No distress.  HENT:  Head: Normocephalic and atraumatic.  Eyes: Conjunctivae are normal. Pupils are equal, round, and reactive to light.  Neck: Normal range of motion.  No carotid bruits noted  Cardiovascular: Normal rate, regular rhythm, normal heart sounds and intact distal pulses.   Pulses:      Radial pulses are 2+ on the right side, and 2+ on the left side.  Pulmonary/Chest: Effort normal.  Musculoskeletal: Normal range of motion. He exhibits no edema.  Neurological: He is alert and oriented to person, place, and time.  Skin: Skin is warm and dry. He is not diaphoretic.  Psychiatric: He has a normal mood and affect. His behavior is normal. Judgment and thought content normal.  Vitals reviewed.  BP 131/81   Pulse 83   Resp 16   Wt 237 lb  12.8 oz (107.9 kg)   BMI 28.20 kg/m   Past Medical History:  Diagnosis Date  . Asthma   . Cellulitis    ? on Lt leg on antibiotics  . Coronary artery disease   . Diabetes mellitus without complication (HCC)    Type 2, insulin dependent  . DVT of leg (deep venous thrombosis) (HCC)    left  . Dysrhythmia   . GERD (gastroesophageal reflux disease)   . History of atrial fibrillation   . History of CEA (carotid endarterectomy)    left side  . Hypertension   . Injury of leg, left    Lt leg reddened and draining on antibiotics  . Myocardial infarction (HCC)   . Pacemaker   . Parkinson's disease (HCC)   . Seasonal allergies    Social History   Social History  . Marital status: Married    Spouse name: N/A  . Number of children: N/A  . Years of education: N/A   Occupational History  . Not on file.   Social History Main Topics  . Smoking status: Never Smoker  . Smokeless tobacco: Never Used  . Alcohol use No  . Drug use: No  . Sexual activity: Not on file   Other Topics Concern  . Not on file   Social History Narrative  . No narrative on file   Past Surgical History:  Procedure Laterality Date  . APPENDECTOMY    . APPLICATION OF A-CELL OF EXTREMITY Left 01/04/2015   Procedure: APPLICATION OF A-CELL  AND VAC ;  Surgeon: Alan Ripperlaire  Sanger, DO;  Location: [facility];  Service: Government social research officer;  Laterality: Left;  . CARDIAC CATHETERIZATION    . CAROTID ENDARTERECTOMY Left   . CORONARY ARTERY BYPASS GRAFT    . HERNIA REPAIR     UHR  . I&D EXTREMITY Left 01/04/2015   Procedure: IRRIGATION AND DEBRIDEMENT OF LOWER LEFT LEG WOUND WITH A CELL AND VAC;  Surgeon: Wayland Denis, DO;  Location: [facility];  Service: Plastics;  Laterality: Left;  . IVC FILTER PLACEMENT ([facility] HX)    . PACEMAKER INSERTION  2012   sss  . PERIPHERAL VASCULAR CATHETERIZATION N/A 11/06/2015   Procedure: IVC Filter Insertion;  Surgeon: Annice Needy, MD;  Location: [facility];  Service: Cardiovascular;  Laterality: N/A;  . PERIPHERAL VASCULAR CATHETERIZATION N/A 01/18/2016   Procedure: IVC Filter Removal;  Surgeon: Annice Needy, MD;  Location: [facility];  Service: Cardiovascular;  Laterality: N/A;  . SKIN SPLIT GRAFT Left 02/03/2015   Procedure: SKIN GRAFT FROM  LEFT THIGH TO LEFT LEG;  Surgeon: Glenna Fellows, MD;  Location: [facility];  Service: Plastics;  Laterality: Left;  . TOTAL HIP ARTHROPLASTY Right 12/05/2015   Procedure: TOTAL HIP ARTHROPLASTY ANTERIOR APPROACH;  Surgeon: Kennedy Bucker, MD;  Location: [facility];  Service: Orthopedics;  Laterality: Right;  [name] VASCULAR SURGERY     Family History  Problem Relation Age of Onset  . Diabetes Mother   . Stroke Father   . Deep vein thrombosis Sister    Allergies  Allergen Reactions  . Lipitor [Atorvastatin] Other (See Comments)    Muscle aches  . Zetia [Ezetimibe]       Assessment & Plan:  Patient presents for a yearly non-invasive study follow up for carotid stenosis. The stenosis has been followed by surveillance duplexes. The patient underwent a bilateral carotid duplex scan which showed no change from the previous exam on 01/05/2016. Duplex is stable with Right ICA stenosis (<50%) and patent left carotid artery endarterectomy without evidence of restenosis. The patient denies experiencing Amaurosis Fugax, TIA like symptoms or focal motor deficits.  1. Bilateral carotid artery stenosis - Stable Studies reviewed with patient. Patient asymptomatic with stable duplex.   No intervention at this time.  Patient to return in one year for surveillance carotid duplex. Patient to remain abstinent of tobacco use. I have discussed with the patient at length the risk factors for and pathogenesis of atherosclerotic disease and encouraged a healthy diet, regular exercise regimen and blood pressure / glucose control.  Patient was instructed to contact our office in the interim with problems  such as arm / leg weakness or numbness, speech / swallowing difficulty or temporary monocular blindness. The patient expresses their understanding.   - VAS US CAROTID; Future  2. Type 2 diabetes mellitus with complication, unspecified whether long term insulin use (HCC) - stable Encouraged good control as its slows the progression of atherosclerotic disease  3. Hyperlipidemia, unspecified hyperlipidemia type - stable Encouraged good control as its slows the progression of atherosclerotic disease  Current Outpatient Prescriptions on File Prior to Visit  Medication Sig Dispense Refill  . aspirin 81 MG tablet Take 81 mg by mouth daily.    . carbidopa-levodopa (SINEMET CR) 50-200 MG per tablet Take 1 tablet by mouth at bedtime.    . carbidopa-levodopa (SINEMET IR) 25-100 MG per tablet Take 2 tablets by mouth 3 (three) times daily.     [name] esomeprazole (NEXIUM) 20 MG capsule Take  20 mg by mouth daily at 12 noon.    [name] ibuprofen (ADVIL,MOTRIN) 200 MG tablet Take 200 mg by mouth every 6 (six) hours as needed. Reported on 12/05/2015    . insulin detemir (LEVEMIR) 100 UNIT/ML injection Inject 24 Units into the skin daily.     . insulin lispro (HUMALOG) 100 UNIT/ML injection Inject 4 Units into the skin 3 (three) times daily before meals. 4-8 units per sliding scale    . lovastatin (MEVACOR) 10 MG tablet Take 10 mg by mouth at bedtime.    . metoprolol tartrate (LOPRESSOR) 25 MG tablet Take 25 mg by mouth 2 (two) times daily. Reported on 12/05/2015    . Multiple Vitamin (MULTIVITAMIN WITH MINERALS) TABS tablet Take 1 tablet by mouth daily.    [name] spironolactone (ALDACTONE) 25 MG tablet Take 25 mg by mouth daily.     No current facility-administered medications on file prior to visit.     There are no Patient Instructions on file for this visit. No Follow-up on file.   KIMBERLY A STEGMAYER, PA-C

## 2018-04-27 ENCOUNTER — Other Ambulatory Visit: Payer: Self-pay | Admitting: Family Medicine

## 2018-04-27 DIAGNOSIS — J90 Pleural effusion, not elsewhere classified: Secondary | ICD-10-CM

## 2018-04-27 DIAGNOSIS — R9389 Abnormal findings on diagnostic imaging of other specified body structures: Secondary | ICD-10-CM

## 2018-04-28 ENCOUNTER — Encounter (INDEPENDENT_AMBULATORY_CARE_PROVIDER_SITE_OTHER): Payer: Self-pay

## 2018-04-28 ENCOUNTER — Ambulatory Visit
Admission: RE | Admit: 2018-04-28 | Discharge: 2018-04-28 | Disposition: A | Discharge status: Home / Self Care | Payer: Medicare HMO | Source: Ambulatory Visit | Attending: Family Medicine | Admitting: Family Medicine

## 2018-04-28 DIAGNOSIS — I700 Atherosclerosis of aorta: Secondary | ICD-10-CM | POA: Insufficient documentation

## 2018-04-28 DIAGNOSIS — R9389 Abnormal findings on diagnostic imaging of other specified body structures: Secondary | ICD-10-CM

## 2018-04-28 DIAGNOSIS — D71 Functional disorders of polymorphonuclear neutrophils: Secondary | ICD-10-CM | POA: Diagnosis not present

## 2018-04-28 DIAGNOSIS — J90 Pleural effusion, not elsewhere classified: Secondary | ICD-10-CM | POA: Diagnosis present

## 2018-04-28 MED ORDER — IOPAMIDOL (ISOVUE-370) INJECTION 76%
75.0000 mL | Freq: Once | INTRAVENOUS | Status: AC | PRN
  Administered 2018-04-28: 75 mL via INTRAVENOUS

## 2018-04-30 ENCOUNTER — Ambulatory Visit (INDEPENDENT_AMBULATORY_CARE_PROVIDER_SITE_OTHER): Payer: Medicare HMO | Admitting: Internal Medicine

## 2018-04-30 ENCOUNTER — Encounter: Payer: Self-pay | Admitting: Internal Medicine

## 2018-04-30 VITALS — BP 114/72 | HR 103 | Resp 16 | Ht 77.0 in | Wt 203.0 lb

## 2018-04-30 DIAGNOSIS — J90 Pleural effusion, not elsewhere classified: Secondary | ICD-10-CM

## 2018-04-30 DIAGNOSIS — R1310 Dysphagia, unspecified: Secondary | ICD-10-CM | POA: Diagnosis not present

## 2018-04-30 DIAGNOSIS — R0602 Shortness of breath: Secondary | ICD-10-CM

## 2018-04-30 MED ORDER — FLUTICASONE-SALMETEROL 250-50 MCG/DOSE IN AEPB: 1.0000 | INHALATION_SPRAY | Freq: Two times a day (BID) | RESPIRATORY_TRACT | 5 refills | Status: DC

## 2018-04-30 NOTE — Patient Instructions (Addendum)
Will send you for a lung function test, a thoracentesis.  Will send you for a swallow evaluation.   Take advair 1 puff  twice daily,  Rinse mouth after use.

## 2018-04-30 NOTE — Progress Notes (Signed)
[facility] Medicine Consultation      Assessment and Plan:  Pleural effusion with pleural calcifications. - Right-sided pleural calcifications, suspicious for pulmonary asbestosis. - Left-sided mildly loculated pleural effusion, will perform thoracentesis to look for evidence of malignancy/mesothelioma given the patient's prominent weight loss.  If negative may need to consider pleural biopsy.  Dysphagia. - Difficulty swallowing with occasional episodes of aspiration. - There are some interstitial changes seen on CT chest, which may be contributed to by aspiration. -We will send for swallow evaluation.  Chronic rhinitis. - Continue Flonase.  Dyspnea on exertion.  - Advancing dyspnea on exertion, may be multifactorial, etiology uncertain at this point. - We will check pulmonary function test to look for evidence of COPD/emphysema, as well as MIP/MIP to look for evidence of respiratory muscle weakness possibly from underlying Parkinson's disease.   Date: 04/30/2018  MRN# [id number] Ian [name] 1946-10-02    Ian [name] is a 72 y.o. old male seen in consultation for chief complaint of:    Chief Complaint  Patient presents with  . Consult    SOB w/activity: prod cough: weight loss    HPI:   The patient is a 72 year old male with a history significant for atrial fibrillation, lower extremity DVT, COPD, OSA, ischemic cardia myopathy, Parkinson's disease. The patient started losing weight about 6 months ago, he has lost abotu 40 pounds. He exercises every other day, working with weights and then walks about half a mile. He has had progressively more trouble doing this because of dyspnea. He has a pacemaker in place.  Wife is present and also gives some of the history. She has noted that he has been having a lot of nasal drainage, chronic cough. She feels that the dyspnea is routine now whereas it was not there before, she will have to slow down in the grocery  store because he can not keep up.  He was diagnosed with parkinson's about 7 years ago, this has been progressing, and he is having worse tremors recently and medications were titrated upwards.  He had a blood clot in his left leg after a long drive. He had an IVC filter placed which was then retreived.   He has never been a smoker. 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   He notes that he has occasional trouble swallowing, wife notes that he chokes on fluids and food 3 to 4 times per day.  He is using flonase. He has symbicort but not using it regularly because he had trouble with it.   He has reflux, he takes nexium which controls it. He denies vomiting.   Imaging personally reviewed CT chest 04/28/2018; pulmonary arterial and left atrial enlargement consistent with pulmonary arterial and pulmonary venous hypertension minimal mediastinal lymphadenopathy, there is a mildly loculated effusion in the left base with compressive atelectasis, there is pleural calcifications on the right, there are interstitial changes peripherally more severe in the bases bilaterally.  Pleural effusion was not seen on previous CT AP on 01/15/2016.   PMHX:   Past Medical History:  Diagnosis Date  . Asthma   . Cellulitis    ? on Lt leg on antibiotics  . Coronary artery disease   . Diabetes mellitus without complication (HCC)    Type 2, insulin dependent  . DVT of leg (deep venous thrombosis) (HCC)    left  .  Dysrhythmia   . GERD (gastroesophageal reflux disease)   . History of atrial fibrillation   . History of CEA (carotid endarterectomy)    left side  . Hypertension   . Injury of leg, left    Lt leg reddened and draining on antibiotics  . Myocardial infarction (HCC)   . Pacemaker   . Parkinson's disease (HCC)   . Seasonal allergies    Surgical Hx:  Past Surgical History:  Procedure Laterality Date  .  APPENDECTOMY    . APPLICATION OF A-CELL OF EXTREMITY Left 01/04/2015   Procedure: APPLICATION OF A-CELL  AND VAC ;  Surgeon: Wayland Denis, DO;  Location: [facility];  Service: Plastics;  Laterality: Left;  . CARDIAC CATHETERIZATION    . CAROTID ENDARTERECTOMY Left   . CORONARY ARTERY BYPASS GRAFT    . HERNIA REPAIR     UHR  . I&D EXTREMITY Left 01/04/2015   Procedure: IRRIGATION AND DEBRIDEMENT OF LOWER LEFT LEG WOUND WITH A CELL AND VAC;  Surgeon: Wayland Denis, DO;  Location: [facility];  Service: Plastics;  Laterality: Left;  . IVC FILTER PLACEMENT ([facility] HX)    . PACEMAKER INSERTION  2012   sss  . PERIPHERAL VASCULAR CATHETERIZATION N/A 11/06/2015   Procedure: IVC Filter Insertion;  Surgeon: Annice Needy, MD;  Location: [facility];  Service: Cardiovascular;  Laterality: N/A;  . PERIPHERAL VASCULAR CATHETERIZATION N/A 01/18/2016   Procedure: IVC Filter Removal;  Surgeon: Annice Needy, MD;  Location: [facility];  Service: Cardiovascular;  Laterality: N/A;  . SKIN SPLIT GRAFT Left 02/03/2015   Procedure: SKIN GRAFT FROM  LEFT THIGH TO LEFT LEG;  Surgeon: Glenna Fellows, MD;  Location: [facility];  Service: Plastics;  Laterality: Left;  . TOTAL HIP ARTHROPLASTY Right 12/05/2015   Procedure: TOTAL HIP ARTHROPLASTY ANTERIOR APPROACH;  Surgeon: Kennedy Bucker, MD;  Location: [facility];  Service: Orthopedics;  Laterality: Right;  [name] VASCULAR SURGERY     Family Hx:  Family History  Problem Relation Age of Onset  . Diabetes Mother   . Stroke Father   . Deep vein thrombosis Sister    Social Hx:   Social History   Tobacco Use  . Smoking status: Never Smoker  . Smokeless tobacco: Never Used  Substance Use Topics  . Alcohol use: No  . Drug use: No   Medication:    Current Outpatient Medications:  .  aspirin 81 MG tablet, Take 81 mg by mouth daily., Disp: , Rfl:  .  carbidopa-levodopa (SINEMET CR) 50-200 MG per tablet, Take 1  tablet by mouth at bedtime., Disp: , Rfl:  .  carbidopa-levodopa (SINEMET IR) 25-100 MG per tablet, Take 2 tablets by mouth 3 (three) times daily. , Disp: , Rfl:  .  esomeprazole (NEXIUM) 20 MG capsule, Take 20 mg by mouth daily at 12 noon., Disp: , Rfl:  .  ibuprofen (ADVIL,MOTRIN) 200 MG tablet, Take 200 mg by mouth every 6 (six) hours as needed. Reported on 12/05/2015, Disp: , Rfl:  .  insulin detemir (LEVEMIR) 100 UNIT/ML injection, Inject 24 Units into the skin daily. , Disp: , Rfl:  .  insulin lispro (HUMALOG) 100 UNIT/ML injection, Inject 4 Units into the skin 3 (three) times daily before meals. 4-8 units per sliding scale, Disp: , Rfl:  .  lovastatin (MEVACOR) 10 MG tablet, Take 10 mg by mouth at bedtime., Disp: , Rfl:  .  metoprolol tartrate (LOPRESSOR) 25 MG  tablet, Take 25 mg by mouth 2 (two) times daily. Reported on 12/05/2015, Disp: , Rfl:  .  Multiple Vitamin (MULTIVITAMIN WITH MINERALS) TABS tablet, Take 1 tablet by mouth daily., Disp: , Rfl:  .  spironolactone (ALDACTONE) 25 MG tablet, Take 25 mg by mouth daily., Disp: , Rfl:    Allergies:  Atorvastatin and Zetia [ezetimibe]  Review of Systems: Gen:  Denies  fever, sweats, chills HEENT: Denies blurred vision, double vision. bleeds, sore throat Cvc:  No dizziness, chest pain. Resp:   Denies cough or sputum production, shortness of breath Gi: Denies swallowing difficulty, stomach pain. Gu:  Denies bladder incontinence, burning urine Ext:   No Joint pain, stiffness. Skin: No skin rash,  hives  Endoc:  No polyuria, polydipsia. Psych: No depression, insomnia. Other:  All other systems were reviewed with the patient and were negative other that what is mentioned in the HPI.   Physical Examination:   VS: BP 114/72 (BP Location: Left Arm, Cuff Size: Normal)   Pulse (!) 103   Resp 16   Ht  (1.956 m)   Wt 203 lb (92.1 kg)   SpO2 98%   BMI 24.07 kg/m   General Appearance: No distress  Neuro:without focal findings,   speech normal,  HEENT: PERRLA, EOM intact.   Pulmonary: normal breath sounds, No wheezing.  CardiovascularNormal S1,S2.  No m/r/g.   Abdomen: Benign, Soft, non-tender. Renal:  No costovertebral tenderness  GU:  No performed at this time. Endoc: No evident thyromegaly, no signs of acromegaly. Skin:   warm, no rashes, no ecchymosis  Extremities: normal, no cyanosis, clubbing.  Other findings:    LABORATORY PANEL:   CBC No results for input(s): WBC, HGB, HCT, PLT in the last 168 hours. ------------------------------------------------------------------------------------------------------------------  Chemistries  No results for input(s): NA, K, CL, CO2, GLUCOSE, BUN, CREATININE, CALCIUM, MG, AST, ALT, ALKPHOS, BILITOT in the last 168 hours.  Invalid input(s): GFRCGP ------------------------------------------------------------------------------------------------------------------  Cardiac Enzymes No results for input(s): TROPONINI in the last 168 hours. ------------------------------------------------------------  RADIOLOGY:  No results found.     Thank  you for the consultation and for allowing [facility] to assist in the care of your patient. Our recommendations are noted above.  Please contact us if we can be of further service.   Wells Guiles, MD.  Board Certified in Internal Medicine, Pulmonary Medicine, Critical Care Medicine, and Sleep Medicine.  [facility] Office Number: [phone]  Santiago Glad, M.D.  Billy Fischer, M.D  04/30/2018

## 2018-05-05 ENCOUNTER — Ambulatory Visit
Admission: RE | Admit: 2018-05-05 | Discharge: 2018-05-05 | Disposition: A | Discharge status: Home / Self Care | Payer: Medicare HMO | Source: Ambulatory Visit | Attending: Internal Medicine | Admitting: Internal Medicine

## 2018-05-05 DIAGNOSIS — J90 Pleural effusion, not elsewhere classified: Secondary | ICD-10-CM | POA: Insufficient documentation

## 2018-05-05 DIAGNOSIS — J9811 Atelectasis: Secondary | ICD-10-CM | POA: Diagnosis not present

## 2018-05-05 DIAGNOSIS — Z9889 Other specified postprocedural states: Secondary | ICD-10-CM | POA: Diagnosis present

## 2018-05-05 LAB — PROTEIN, PLEURAL OR PERITONEAL FLUID: Total protein, fluid: <3 g/dL

## 2018-05-05 LAB — ALBUMIN, PLEURAL OR PERITONEAL FLUID: ALBUMIN FL: 1.6 g/dL

## 2018-05-05 MED ORDER — LIDOCAINE HCL (PF) 1 % IJ SOLN
INTRAMUSCULAR | Status: AC
  Filled 2018-05-05: qty 5

## 2018-05-05 MED ORDER — SODIUM CHLORIDE 0.9 % IV SOLN
INTRAVENOUS | Status: DC
  Administered 2018-05-05: 14:00:00 via INTRAVENOUS

## 2018-05-05 NOTE — OR Nursing (Signed)
Pt to room 24 prepped for procedure.  IV started Left Antecub, O2 on via [place] at 3 l/min.    Dr Ardyth Man in to discuss plan of care, consent signed. Time out per procedure,   Pt prepped at 1315,  Specimens obtained at 1328. Pt tolerated procedure well, no pain, no SOB.  Specimens labeled and sent to lab. Chest xray obtained.

## 2018-05-05 NOTE — OR Nursing (Signed)
Chest xray clear.  Pt has no complaints, VSS, Iv d/c.  Pt d/c home ambulatory with wife.  AVS with instructions given

## 2018-05-05 NOTE — Procedures (Signed)
PROCEDURE NOTE: Left Ultrasound guided THORACENTESIS    Date: 05/05/2018,  [id number] Same day surgery.  MRN# [id number] Era Bumpers   Indication: Pleural effusion-- left  A time-out was completed verifying correct patient, procedure, site, positioning, and implant(s) or special equipment if applicable.  Ultrasound guidance wasnused and appropriate fluid pocket was identified and marked.  Marking was performed at the ninth interspace in the lateral scapular line.   Patient was positioned, prepped and draped in usual sterile fashion. 1 % Lidocaine was used to anesthetize the area.  Thoracentesis needle was introduced into the pleural space the catheter was then advanced over the introducer needle, kept in place, hooked up to suction, approximately 300 cc of blood-tinged pleural fluid was removed from the left pleural space.  Postoperative A chest xray was ordered to evaluate for pneumothorax.  Total Fluid Removed: Approximately 300 cc Color of Fluid: Bloody   Patient tolerated the procedure <well> and there were <no> complications.  Wells Guiles, M.D. Pulmonary & Critical Care Medicine

## 2018-05-06 LAB — ACID FAST SMEAR (AFB, MYCOBACTERIA): Acid Fast Smear: NEGATIVE

## 2018-05-06 LAB — LD, BODY FLUID (OTHER): LD, Body Fluid: 202 IU/L

## 2018-05-09 LAB — BODY FLUID CULTURE: Culture: NO GROWTH

## 2018-05-12 LAB — COMP PANEL: LEUKEMIA/LYMPHOMA

## 2018-05-19 ENCOUNTER — Telehealth: Payer: Self-pay | Admitting: Internal Medicine

## 2018-05-19 NOTE — Telephone Encounter (Signed)
Pleural results did not show any abnormality. Needs to follow up after PFT as per plan.

## 2018-05-19 NOTE — Telephone Encounter (Signed)
Pt spouse calling stating they received an e-mail stating the specimen we collected from patient was not applicable and she is very concerned about this and needs a call back   Please call back

## 2018-05-19 NOTE — Telephone Encounter (Signed)
Please review results of pleural fluid. Spouse got a mychart a is very concerned with lung disease. Please advise.

## 2018-05-20 NOTE — Telephone Encounter (Signed)
Returned call to patient and advised as stated in previous message.

## 2018-05-21 ENCOUNTER — Ambulatory Visit: Payer: Medicare HMO | Attending: Internal Medicine

## 2018-05-21 DIAGNOSIS — R0602 Shortness of breath: Secondary | ICD-10-CM | POA: Diagnosis present

## 2018-05-21 MED ORDER — ALBUTEROL SULFATE (2.5 MG/3ML) 0.083% IN NEBU
2.5000 mg | INHALATION_SOLUTION | Freq: Once | RESPIRATORY_TRACT | Status: AC
  Administered 2018-05-21: 2.5 mg via RESPIRATORY_TRACT
  Filled 2018-05-21: qty 3

## 2018-06-01 ENCOUNTER — Ambulatory Visit
Admission: RE | Admit: 2018-06-01 | Discharge: 2018-06-01 | Disposition: A | Discharge status: Home / Self Care | Payer: Medicare HMO | Source: Ambulatory Visit | Attending: Internal Medicine | Admitting: Internal Medicine

## 2018-06-01 ENCOUNTER — Ambulatory Visit: Payer: Medicare HMO | Admitting: Internal Medicine

## 2018-06-01 DIAGNOSIS — I252 Old myocardial infarction: Secondary | ICD-10-CM | POA: Insufficient documentation

## 2018-06-01 DIAGNOSIS — I10 Essential (primary) hypertension: Secondary | ICD-10-CM | POA: Diagnosis not present

## 2018-06-01 DIAGNOSIS — K219 Gastro-esophageal reflux disease without esophagitis: Secondary | ICD-10-CM | POA: Diagnosis not present

## 2018-06-01 DIAGNOSIS — Z96641 Presence of right artificial hip joint: Secondary | ICD-10-CM | POA: Diagnosis not present

## 2018-06-01 DIAGNOSIS — R1310 Dysphagia, unspecified: Secondary | ICD-10-CM | POA: Diagnosis present

## 2018-06-01 DIAGNOSIS — Z950 Presence of cardiac pacemaker: Secondary | ICD-10-CM | POA: Diagnosis not present

## 2018-06-01 DIAGNOSIS — G20 Parkinson's disease: Secondary | ICD-10-CM | POA: Diagnosis not present

## 2018-06-01 DIAGNOSIS — E119 Type 2 diabetes mellitus without complications: Secondary | ICD-10-CM | POA: Diagnosis not present

## 2018-06-01 DIAGNOSIS — R1312 Dysphagia, oropharyngeal phase: Secondary | ICD-10-CM | POA: Diagnosis not present

## 2018-06-01 DIAGNOSIS — I2510 Atherosclerotic heart disease of native coronary artery without angina pectoris: Secondary | ICD-10-CM | POA: Diagnosis not present

## 2018-06-01 DIAGNOSIS — J45909 Unspecified asthma, uncomplicated: Secondary | ICD-10-CM | POA: Insufficient documentation

## 2018-06-01 DIAGNOSIS — Z951 Presence of aortocoronary bypass graft: Secondary | ICD-10-CM | POA: Diagnosis not present

## 2018-06-01 NOTE — Therapy (Signed)
[facility] DI[facility]ST[facility] RADIOLOGY [address] [place], [place], [address] Phone: [phone]   Fax:     Modified Barium Swallow  Patient Details  Name: Ian [name] MRN: [id number] Date of Birth: 1946/11/25 No data recorded  Encounter Date: 06/01/2018  End of Session - 06/01/18 1324    Visit Number  1    Number of Visits  1    Date for SLP Re-Evaluation  06/01/18    SLP Start Time  1235    SLP Stop Time   1325    SLP Time Calculation (min)  50 min    Activity Tolerance  Patient tolerated treatment well       Past Medical History:  Diagnosis Date  . Asthma   . Cellulitis    ? on Lt leg on antibiotics  . Coronary artery disease   . Diabetes mellitus without complication (HCC)    Type 2, insulin dependent  . DVT of leg (deep venous thrombosis) (HCC)    left  . Dysrhythmia   . GERD (gastroesophageal reflux disease)   . History of atrial fibrillation   . History of CEA (carotid endarterectomy)    left side  . Hypertension   . Injury of leg, left    Lt leg reddened and draining on antibiotics  . Myocardial infarction (HCC)   . Pacemaker   . Parkinson's disease (HCC)   . Seasonal allergies     Past Surgical History:  Procedure Laterality Date  . APPENDECTOMY    . APPLICATION OF A-CELL OF EXTREMITY Left 01/04/2015   Procedure: APPLICATION OF A-CELL  AND VAC ;  Surgeon: Wayland Denis, DO;  Location: [facility];  Service: Plastics;  Laterality: Left;  . CARDIAC CATHETERIZATION    . CAROTID ENDARTERECTOMY Left   . CORONARY ARTERY BYPASS GRAFT    . HERNIA REPAIR     UHR  . I&D EXTREMITY Left 01/04/2015   Procedure: IRRIGATION AND DEBRIDEMENT OF LOWER LEFT LEG WOUND WITH A CELL AND VAC;  Surgeon: Wayland Denis, DO;  Location: [facility];  Service: Plastics;  Laterality: Left;  . IVC FILTER PLACEMENT ([facility] HX)    . PACEMAKER INSERTION  2012   sss  . PERIPHERAL VASCULAR CATHETERIZATION N/A  11/06/2015   Procedure: IVC Filter Insertion;  Surgeon: Annice Needy, MD;  Location: [facility] IN[facility]ASIVE CV LAB;  Service: Cardiovascular;  Laterality: N/A;  . PERIPHERAL VASCULAR CATHETERIZATION N/A 01/18/2016   Procedure: IVC Filter Removal;  Surgeon: Annice Needy, MD;  Location: [facility];  Service: Cardiovascular;  Laterality: N/A;  . SKIN SPLIT GRAFT Left 02/03/2015   Procedure: SKIN GRAFT FROM  LEFT THIGH TO LEFT LEG;  Surgeon: Glenna Fellows, MD;  Location: [facility];  Service: Plastics;  Laterality: Left;  . TOTAL HIP ARTHROPLASTY Right 12/05/2015   Procedure: TOTAL HIP ARTHROPLASTY ANTERIOR APPROACH;  Surgeon: Kennedy Bucker, MD;  Location: [facility] ORS;  Service: Orthopedics;  Laterality: Right;  [name] VASCULAR SURGERY      There were no vitals filed for this visit.   Subjective: Patient behavior: (alertness, ability to follow instructions, etc.): The patient is able to verbalize his swallowing complaints and follow directions  Chief complaint: patient reports occasional choking/sensation of aspiration   Objective:  Radiological Procedure: A videoflouroscopic evaluation of oral-preparatory, reflex initiation, and pharyngeal phases of the swallow was performed; as well as a screening of the upper esophageal phase.  I. POSTURE: Upright in MBS  chair  II. VIEW: Lateral  III. COMPENSATORY STRATEGIES: follow up swallows clear oral and pharyngeal residue  BOLUSES ADMINISTERED:   Thin Liquid: 1 small, 3 rapid consecutive   Nectar-thick Liquid: 1 moderate   Honey-thick Liquid: DNT   Puree: 2 teaspoon presentations   Mechanical Soft: 1/4 graham cracker in applesauce  IV. RESULTS OF EVALUATION: A. ORAL PREPARATORY PHASE: (The lips, tongue, and velum are observed for strength and coordination)       **Overall Severity Rating: Mild; disorganized, slow posterior transfer, mild oral residue post swallow, piecemeal swallowing  B. SWALLOW INITIATION/REFLEX: (The reflex is  normal if "triggered" by the time the bolus reached the base of the tongue)  **Overall Severity Rating: Mild; triggers at the the valleculae  C. PHARYNGEAL PHASE: (Pharyngeal function is normal if the bolus shows rapid, smooth, and continuous transit through the pharynx and there is no pharyngeal residue after the swallow)  **Overall Severity Rating: Minimal; Decreased hyolaryngeal excursion, decreased tongue base retraction, incomplete epiglottic inversion, mild residue in the valleculae and trace residue in the pyriform sinuses and along the posterior pharyngeal wall.  D. LARYNGEAL PENETRATION: (Material entering into the laryngeal inlet/vestibule but not aspirated) None  E. ASPIRATION: None  F. ESOPHAGEAL PHASE: (Screening of the upper esophagus) No abnormality within the viewable cervical esophagus  ASSESSMENT: This 72 year old man; with Parkinson's disease and concern for aspiration secondary interstitial changes seen on CT chest; is presenting with mild oropharyngeal dysphagia characterized by slow, disorganized oral management, delayed pharyngeal swallow initiation, and decreased pharyngeal pressure generation with trace-to-mild pharyngeal residue.  There is no observed laryngeal penetration or tracheal aspiration.  The patient does not appear to be at significant risk for ongoing / chronic prandial aspiration.  PLAN/RECOMMENDATIONS:   A. Diet: Usual/regular diet   B. Swallowing Precautions: reduce distractions while eating and drinking   C. Recommended consultation to: follow up with MDs as recommended   D. Therapy recommendations: speech therapy is not indicated at this time.  Would recommend efhigh effort/high intensity vocal exercises (such as LSVT-LOUD) if patient becomes more hyophonic.      E. Results and recommendations were discussed with the patient and his son immediately following the study and the final report routed to the referring MD.     Oropharyngeal dysphagia -  Plan: DG OP Swallowing Func-Medicare/Speech Path, DG OP Swallowing Func-Medicare/Speech Path        Problem List Patient Active Problem List   Diagnosis Date Noted  . Bilateral carotid artery stenosis 06/03/2017  . Primary osteoarthritis of right hip 12/05/2015  . Ulcer of left lower extremity, limited to breakdown of skin (HCC) 05/10/2015  . Parkinson disease (HCC) 04/13/2015  . Ischemic cardiomyopathy 10/09/2014  . Long term (current) use of anticoagulants 04/07/2014  . OSA (obstructive sleep apnea) 11/13/2011  . Atrial flutter (HCC) 11/12/2011  . CAD (coronary artery disease) 11/12/2011  . Cardiac pacemaker in situ 11/12/2011  . Colon polyp 11/12/2011  . COPD (chronic obstructive pulmonary disease) (HCC) 11/12/2011  . Type 2 diabetes mellitus with stage 2 chronic kidney disease, with long-term current use of insulin (HCC) 11/12/2011  . Deep vein thrombosis of lower extremity (HCC) 11/12/2011  . Hyperlipidemia 11/12/2011  . PAF (paroxysmal atrial fibrillation) (HCC) 11/12/2011  . Gastro-esophageal reflux disease with esophagitis 11/12/2011  . Sick sinus syndrome (HCC) 11/12/2011  . Tremor 11/12/2011  . Allergic rhinitis with asthma without status asthmaticus 11/12/2011  . History of DVT of lower extremity 11/12/2011    Dollene PrimroseSusan G Chevy Virgo,  MS/CCC- SLP  Leandrew Koyanagi 06/01/2018, 1:25 PM   [facility] DIAGNOSTIC RADIOLOGY [address] [place], [place], [address] Phone: [phone]   Fax:     Name: Ian [name] MRN: [id number] Date of Birth: [birth year]

## 2018-06-05 NOTE — Progress Notes (Signed)
[facility] Pulmonary Medicine Consultation      Assessment and Plan:  Pleural effusion with pleural calcifications, pulmonary asbestosis.. - s/p right thoracentesis negative, will continue to monitor and repeat if needed.  - Left-sided mildly loculated pleural effusion, he has prominent weight loss, concerning for malignancy, mesothelioma. Will repeat imaging and thoracentesis if pleural effusion is reaccumulated.  Respiratory muscle weakness with Parkinson's disease, pulmonary fibrosis.  - Patient has mild memory fibrotic changes, I doubt that these are the major contributor to his mild exertional dyspnea. - Patient has restrictive lung disease, respiratory muscle weakness, likely secondary to Parkinson's disease.  Dysphagia. - Difficulty swallowing with occasional episodes of aspiration. - s/p swallow eval with no evidence of aspiration.   Chronic rhinitis. - Continue Flonase.  Orders Placed This Encounter  Procedures  . DG Chest 2 View   Return in about 6 weeks (around 07/20/2018).Chest xray in 4 weeks.     Date: 06/05/2018  MRN# [id number] Ian [name] Apr 10, 1946    Ian [name] is a 72 y.o. old male seen in consultation for chief complaint of:    Chief Complaint  Patient presents with  . Dysphagia    pt here for f/u DG swallow/ and thoracentisis:  . Shortness of Breath    improved    HPI:   The patient is a 72 year old male with a history significant for atrial fibrillation, lower extremity DVT, COPD, OSA, ischemic cardi76myopathy, Parkinson's disease, status post IVC placement and subsequent retrieval. Patient has had a significant 40 pound weight loss, imaging showed mildly loculated left basal pleural effusion with compressive atelectasis and pleural calcifications on the right.  There are also some interstitial changes.  At last exam was also noted that he was having dysphasia symptoms, therefore sent for swallow eval showed disorganized oral  swallowing without evidence of aspiration.  Since his last visit he feels that his breathing has been better since having the thoracentesis. He continues to have cough and nasal drainage.   He is using flonase. He has symbicort but not using it regularly because he had trouble with it.   He has reflux, he takes nexium which controls it. He denies vomiting.   **PFT 05/21/2018>> FVC is 50% predicted, FEV1 is 54% predicted, ratio is 85%.  There is no reversibility with bronchodilator therapy.  Flow volume loop shows restriction.  TLC is 62%, vital capacity is 50% DLCO 57%.  PI max is 22, PE max is 31. - Overall this test shows moderate to severe restrictive lung disease.  Significant respiratory muscle weakness with a maximal inspiratory force of 22. **Left thoracentesis 05/05/2018>> 300 cc of blood-tinged fluid was drained, results were suboptimal for flow cytometry. **CT chest 04/28/2018>> pulmonary arterial and left atrial enlargement consistent with pulmonary arterial and pulmonary venous hypertension minimal mediastinal lymphadenopathy, there is a mildly loculated effusion in the left base with compressive atelectasis, there is pleural calcifications on the right, there are interstitial changes peripherally more severe in the bases bilaterally.  Pleural effusion was not seen on previous CT AP on 01/15/2016.  **06/01/18 Swallow Eval>>  ASSESSMENT: This 72 year old man; with Parkinson's disease and concern for aspiration secondary interstitial changes seen on CT chest; is presenting with mild oropharyngeal dysphagia characterized by slow, disorganized oral management, delayed pharyngeal swallow initiation, and decreased pharyngeal pressure generation with trace-to-mild pharyngeal residue.  There is no observed laryngeal penetration or tracheal aspiration.  The patient does not appear to be at significant risk for ongoing / chronic  prandial aspiration.  PLAN/RECOMMENDATIONS:              A. Diet:  Usual/regular diet              B. Swallowing Precautions: reduce distractions while eating and drinking              C. Recommended consultation to: follow up with MDs as recommended              D. Therapy recommendations: speech therapy is not indicated at this time.  Would recommend efhigh effort/high intensity vocal exercises (such as LSVT-LOUD) if patient becomes more hyophonic.                 E. Results and recommendations were discussed with the patient and his son immediately following the study and the final report routed to the referring MD.    Oropharyngeal dysphagia - Plan: DG OP Swallowing Func-Medicare/Speech Path, DG OP Swallowing Func-Medicare/Speech Path    Medication:    Current Outpatient Medications:  .  aspirin 81 MG tablet, Take 81 mg by mouth daily., Disp: , Rfl:  .  carbidopa-levodopa (SINEMET CR) 50-200 MG per tablet, Take 1 tablet by mouth at bedtime., Disp: , Rfl:  .  carbidopa-levodopa (SINEMET IR) 25-100 MG per tablet, Take 2 tablets by mouth 3 (three) times daily. , Disp: , Rfl:  .  esomeprazole (NEXIUM) 20 MG capsule, Take 20 mg by mouth daily at 12 noon., Disp: , Rfl:  .  Fluticasone-Salmeterol (ADVAIR DISKUS) 250-50 MCG/DOSE AEPB, Inhale 1 puff into the lungs 2 (two) times daily., Disp: 1 each, Rfl: 5 .  ibuprofen (ADVIL,MOTRIN) 200 MG tablet, Take 200 mg by mouth every 6 (six) hours as needed. Reported on 12/05/2015, Disp: , Rfl:  .  insulin detemir (LEVEMIR) 100 UNIT/ML injection, Inject 24 Units into the skin daily. , Disp: , Rfl:  .  insulin lispro (HUMALOG) 100 UNIT/ML injection, Inject 4 Units into the skin 3 (three) times daily before meals. 4-8 units per sliding scale, Disp: , Rfl:  .  lovastatin (MEVACOR) 10 MG tablet, Take 10 mg by mouth at bedtime., Disp: , Rfl:  .  metoprolol tartrate (LOPRESSOR) 25 MG tablet, Take 25 mg by mouth 2 (two) times daily. Reported on 12/05/2015, Disp: , Rfl:  .  Multiple Vitamin (MULTIVITAMIN WITH MINERALS)  TABS tablet, Take 1 tablet by mouth daily., Disp: , Rfl:  .  spironolactone (ALDACTONE) 25 MG tablet, Take 25 mg by mouth daily., Disp: , Rfl:    Allergies:  Atorvastatin and Zetia [ezetimibe]    LABORATORY PANEL:   CBC No results for input(s): WBC, HGB, HCT, PLT in the last 168 hours. ------------------------------------------------------------------------------------------------------------------  Chemistries  No results for input(s): NA, K, CL, CO2, GLUCOSE, BUN, CREATININE, CALCIUM, MG, AST, ALT, ALKPHOS, BILITOT in the last 168 hours.  Invalid input(s): GFRCGP ------------------------------------------------------------------------------------------------------------------  Cardiac Enzymes No results for input(s): TROPONINI in the last 168 hours. ------------------------------------------------------------  RADIOLOGY:  No results found.     Thank  you for the consultation and for allowing [facility] to assist in the care of your patient. Our recommendations are noted above.  Please contact us if we can be of further service.   Wells Guileseep Avontae Burkhead, M.D., F.C.C.P.  Board Certified in Internal Medicine, Pulmonary Medicine, Critical Care Medicine, and Sleep Medicine.  [facility] Office Number: [phone]

## 2018-06-08 ENCOUNTER — Encounter: Payer: Self-pay | Admitting: Internal Medicine

## 2018-06-08 ENCOUNTER — Ambulatory Visit (INDEPENDENT_AMBULATORY_CARE_PROVIDER_SITE_OTHER): Payer: Medicare HMO | Admitting: Internal Medicine

## 2018-06-08 VITALS — BP 110/62 | HR 118 | Resp 16 | Ht 77.0 in | Wt 197.0 lb

## 2018-06-08 DIAGNOSIS — J90 Pleural effusion, not elsewhere classified: Secondary | ICD-10-CM

## 2018-06-08 NOTE — Patient Instructions (Signed)
Restart flonase.  Can stop symbicort/advair if they are making your breathing worse.  Will repeat chest Xray in 1 month to see if fluid is coming back.

## 2018-06-09 ENCOUNTER — Ambulatory Visit (INDEPENDENT_AMBULATORY_CARE_PROVIDER_SITE_OTHER): Payer: Medicare Other | Admitting: Vascular Surgery

## 2018-06-09 ENCOUNTER — Encounter (INDEPENDENT_AMBULATORY_CARE_PROVIDER_SITE_OTHER): Payer: Medicare Other

## 2018-06-19 LAB — ACID FAST CULTURE WITH REFLEXED SENSITIVITIES: ACID FAST CULTURE - AFSCU3: NEGATIVE

## 2018-06-19 LAB — ACID FAST CULTURE WITH REFLEXED SENSITIVITIES (MYCOBACTERIA)

## 2018-07-20 ENCOUNTER — Ambulatory Visit
Admission: RE | Admit: 2018-07-20 | Discharge: 2018-07-20 | Disposition: A | Discharge status: Home / Self Care | Payer: Medicare HMO | Source: Ambulatory Visit | Attending: Internal Medicine | Admitting: Internal Medicine

## 2018-07-20 ENCOUNTER — Ambulatory Visit (INDEPENDENT_AMBULATORY_CARE_PROVIDER_SITE_OTHER): Payer: Medicare HMO | Admitting: Internal Medicine

## 2018-07-20 ENCOUNTER — Encounter: Payer: Self-pay | Admitting: Internal Medicine

## 2018-07-20 VITALS — BP 122/80 | HR 71 | Ht 77.0 in

## 2018-07-20 DIAGNOSIS — J90 Pleural effusion, not elsewhere classified: Secondary | ICD-10-CM | POA: Diagnosis not present

## 2018-07-20 DIAGNOSIS — J929 Pleural plaque without asbestos: Secondary | ICD-10-CM | POA: Diagnosis not present

## 2018-07-20 DIAGNOSIS — R0609 Other forms of dyspnea: Secondary | ICD-10-CM | POA: Diagnosis present

## 2018-07-20 DIAGNOSIS — R0602 Shortness of breath: Secondary | ICD-10-CM | POA: Diagnosis not present

## 2018-07-20 NOTE — Progress Notes (Signed)
[facility] Pulmonary Medicine     Assessment and Plan:  Pleural effusion with pleural calcifications, pulmonary asbestosis.. - s/p right thoracentesis negative, will continue to monitor and repeat if needed.  - Left-sided mildly loculated pleural effusion, he has prominent weight loss Will repeat imaging and thoracentesis if pleural effusion is reaccumulated.  Respiratory muscle weakness with Parkinson's disease, pulmonary fibrosis.  - Patient has mild  fibrotic changes, I doubt that these are the major contributor to his mild exertional dyspnea. - Patient has restrictive lung disease, respiratory muscle weakness, likely secondary to Parkinson's disease.  Dysphagia. - Difficulty swallowing with occasional episodes of aspiration. - s/p swallow eval with no evidence of aspiration.   Chronic rhinitis. - Continue Flonase.  Orders Placed This Encounter  Procedures  . DG Chest 2 View   Return in about 6 months (around 01/20/2019).    Date: 07/20/2018  MRN# [id number] Ian [name] 1946/03/01    Ian [name] is a 72 y.o. old male seen in consultation for chief complaint of:    Chief Complaint  Patient presents with  . Pleural Effusion    pt did not have cxr done. Pt reports breathing is doing much better. He still gets choked.    HPI:   The patient is a 72 year old male with a history significant for atrial fibrillation, lower extremity DVT, COPD, OSA, ischemic cardiomyopathy, Parkinson's disease, status post IVC placement and subsequent retrieval.  He has had a swallowing evaluation which showed disorganized oral swallowing without evidence of aspiration. At last visit it was noted the patient had a significant 40 pound weight loss, with a mildly loculated left pleural effusion with pleural calcification on the right.  He was asked to return in approximately 1 month with repeat chest x-ray to see if the effusion had reaccumulated. Today he has not had the CXR done.  However he feels that his breathing has improved. He has no new complaints today. He continues to lose weight.   He has reflux, he takes nexium which controls it. He denies vomiting.   **PFT 05/21/2018>> FVC is 50% predicted, FEV1 is 54% predicted, ratio is 85%.  There is no reversibility with bronchodilator therapy.  Flow volume loop shows restriction.  TLC is 62%, vital capacity is 50% DLCO 57%.  PI max is 22, PE max is 31. - Overall this test shows moderate to severe restrictive lung disease.  Significant respiratory muscle weakness with a maximal inspiratory force of 22. **Left thoracentesis 05/05/2018>> 300 cc of blood-tinged fluid was drained, results were suboptimal for flow cytometry. **CT chest 04/28/2018>> pulmonary arterial and left atrial enlargement consistent with pulmonary arterial and pulmonary venous hypertension minimal mediastinal lymphadenopathy, there is a mildly loculated effusion in the left base with compressive atelectasis, there is pleural calcifications on the right, there are interstitial changes peripherally more severe in the bases bilaterally.  Pleural effusion was not seen on previous CT AP on 01/15/2016.  **06/01/18 Swallow Eval>>  ASSESSMENT: This 72 year old man; with Parkinson's disease and concern for aspiration secondary interstitial changes seen on CT chest; is presenting with mild oropharyngeal dysphagia characterized by slow, disorganized oral management, delayed pharyngeal swallow initiation, and decreased pharyngeal pressure generation with trace-to-mild pharyngeal residue.  There is no observed laryngeal penetration or tracheal aspiration.  The patient does not appear to be at significant risk for ongoing / chronic prandial aspiration.  PLAN/RECOMMENDATIONS:              A. Diet: Usual/regular diet  B. Swallowing Precautions: reduce distractions while eating and drinking              C. Recommended consultation to: follow up with MDs as  recommended              D. Therapy recommendations: speech therapy is not indicated at this time.  Would recommend efhigh effort/high intensity vocal exercises (such as LSVT-LOUD) if patient becomes more hyophonic.                 E. Results and recommendations were discussed with the patient and his son immediately following the study and the final report routed to the referring MD.    Oropharyngeal dysphagia - Plan: DG OP Swallowing Func-Medicare/Speech Path, DG OP Swallowing Func-Medicare/Speech Path    Medication:    Current Outpatient Medications:  .  aspirin 81 MG tablet, Take 81 mg by mouth daily., Disp: , Rfl:  .  carbidopa-levodopa (SINEMET CR) 50-200 MG per tablet, Take 1 tablet by mouth at bedtime., Disp: , Rfl:  .  carbidopa-levodopa (SINEMET IR) 25-100 MG per tablet, Take 2 tablets by mouth 3 (three) times daily. , Disp: , Rfl:  .  esomeprazole (NEXIUM) 20 MG capsule, Take 20 mg by mouth daily at 12 noon., Disp: , Rfl:  .  Fluticasone-Salmeterol (ADVAIR DISKUS) 250-50 MCG/DOSE AEPB, Inhale 1 puff into the lungs 2 (two) times daily., Disp: 1 each, Rfl: 5 .  ibuprofen (ADVIL,MOTRIN) 200 MG tablet, Take 200 mg by mouth every 6 (six) hours as needed. Reported on 12/05/2015, Disp: , Rfl:  .  insulin detemir (LEVEMIR) 100 UNIT/ML injection, Inject 24 Units into the skin daily. , Disp: , Rfl:  .  insulin lispro (HUMALOG) 100 UNIT/ML injection, Inject 4 Units into the skin 3 (three) times daily before meals. 4-8 units per sliding scale, Disp: , Rfl:  .  lovastatin (MEVACOR) 10 MG tablet, Take 10 mg by mouth at bedtime., Disp: , Rfl:  .  metoprolol tartrate (LOPRESSOR) 25 MG tablet, Take 25 mg by mouth 2 (two) times daily. Reported on 12/05/2015, Disp: , Rfl:  .  Multiple Vitamin (MULTIVITAMIN WITH MINERALS) TABS tablet, Take 1 tablet by mouth daily., Disp: , Rfl:  .  spironolactone (ALDACTONE) 25 MG tablet, Take 25 mg by mouth daily., Disp: , Rfl:    Allergies:  Atorvastatin  and Zetia [ezetimibe]  Review of Systems:  Constitutional: Feels well. Cardiovascular: No chest pain.  Pulmonary: Denies dyspnea.   The remainder of systems were reviewed and were found to be negative other than what is documented in the HPI.    Physical Examination:   VS: BP 122/80 (BP Location: Left Arm, Cuff Size: Normal)   Pulse 71   Ht 6\' 5"  (1.956 m)   SpO2 98%   BMI 23.36 kg/m   General Appearance: No distress  Neuro:without focal findings, mental status, speech normal, alert and oriented HEENT: PERRLA, EOM intact Pulmonary: No wheezing, No rales  CardiovascularNormal S1,S2.  No m/r/g.  Abdomen: Benign, Soft, non-tender, No masses Renal:  No costovertebral tenderness  GU:  No performed at this time. Endoc: No evident thyromegaly, no signs of acromegaly or Cushing features Skin:   warm, no rashes, no ecchymosis  Extremities: normal, no cyanosis, clubbing.     LABORATORY PANEL:   CBC No results for input(s): WBC, HGB, HCT, PLT in the last 168 hours. ------------------------------------------------------------------------------------------------------------------  Chemistries  No results for input(s): NA, K, CL, CO2, GLUCOSE, BUN, CREATININE, CALCIUM, MG, AST, ALT, ALKPHOS, BILITOT  in the last 168 hours.  Invalid input(s): GFRCGP ------------------------------------------------------------------------------------------------------------------  Cardiac Enzymes No results for input(s): TROPONINI in the last 168 hours. ------------------------------------------------------------  RADIOLOGY:  No results found.     Thank  you for the consultation and for allowing [facility] to assist in the care of your patient. Our recommendations are noted above.  Please contact us if we can be of further service.   Wells Guileseep Woodrow Drab, M.D., F.C.C.P.  Board Certified in Internal Medicine, Pulmonary Medicine, Critical Care Medicine, and Sleep  Medicine.  [facility] Office Number: [phone]

## 2018-07-28 ENCOUNTER — Ambulatory Visit (INDEPENDENT_AMBULATORY_CARE_PROVIDER_SITE_OTHER): Payer: Medicare HMO | Admitting: Vascular Surgery

## 2018-07-28 ENCOUNTER — Ambulatory Visit (INDEPENDENT_AMBULATORY_CARE_PROVIDER_SITE_OTHER): Payer: Medicare HMO

## 2018-07-28 ENCOUNTER — Encounter (INDEPENDENT_AMBULATORY_CARE_PROVIDER_SITE_OTHER): Payer: Self-pay | Admitting: Vascular Surgery

## 2018-07-28 ENCOUNTER — Other Ambulatory Visit: Payer: Self-pay

## 2018-07-28 VITALS — BP 107/56 | HR 70 | Ht 77.0 in | Wt 188.0 lb

## 2018-07-28 DIAGNOSIS — Z794 Long term (current) use of insulin: Secondary | ICD-10-CM

## 2018-07-28 DIAGNOSIS — I10 Essential (primary) hypertension: Secondary | ICD-10-CM | POA: Insufficient documentation

## 2018-07-28 DIAGNOSIS — I2510 Atherosclerotic heart disease of native coronary artery without angina pectoris: Secondary | ICD-10-CM

## 2018-07-28 DIAGNOSIS — I6523 Occlusion and stenosis of bilateral carotid arteries: Secondary | ICD-10-CM

## 2018-07-28 DIAGNOSIS — E1122 Type 2 diabetes mellitus with diabetic chronic kidney disease: Secondary | ICD-10-CM | POA: Diagnosis not present

## 2018-07-28 DIAGNOSIS — N182 Chronic kidney disease, stage 2 (mild): Secondary | ICD-10-CM

## 2018-07-28 NOTE — Assessment & Plan Note (Signed)
blood pressure control important in reducing the progression of atherosclerotic disease. On appropriate oral medications.  

## 2018-07-28 NOTE — Assessment & Plan Note (Signed)
blood glucose control important in reducing the progression of atherosclerotic disease. Also, involved in wound healing. On appropriate medications.  

## 2018-07-28 NOTE — Assessment & Plan Note (Signed)
His duplex today shows a widely patent left carotid endarterectomy and stenosis that falls in the mild, less than 40% range on the right although calcific plaque may mask some higher velocities.  Clearly, this has not significantly progressed.  Continue current medical regimen and recheck in 1 year.

## 2018-07-28 NOTE — Progress Notes (Signed)
MRN : [id number]  Ian [name] is a 72 y.o. ([birth year]) male who presents with chief complaint of  Chief Complaint  Patient presents with  . Follow-up    yearly cartoid stenosis   .  History of Present Illness: Patient returns in follow-up of his carotid disease.  He has been having some pulmonary issues and has had some weight loss since his last visit.  He denies any focal neurologic symptoms. Specifically, the patient denies amaurosis fugax, speech or swallowing difficulties, or arm or leg weakness or numbness. His duplex today shows a widely patent left carotid endarterectomy and stenosis that falls in the mild, less than 40% range on the right although calcific plaque may mask some higher velocities.  Current Outpatient Medications  Medication Sig Dispense Refill  . aspirin 81 MG tablet Take 81 mg by mouth daily.    . carbidopa-levodopa (SINEMET CR) 50-200 MG per tablet Take 1 tablet by mouth at bedtime.    . carbidopa-levodopa (SINEMET IR) 25-100 MG per tablet Take 2 tablets by mouth 3 (three) times daily.     [name]. esomeprazole (NEXIUM) 20 MG capsule Take 20 mg by mouth daily at 12 noon.    . insulin detemir (LEVEMIR) 100 UNIT/ML injection Inject 24 Units into the skin daily.     . insulin lispro (HUMALOG) 100 UNIT/ML injection Inject 4 Units into the skin 3 (three) times daily before meals. 4-8 units per sliding scale    . Insulin Pen Needle (BD PEN NEEDLE NANO U/F) 32G X 4 MM MISC USE AS DIRECTED 4 TIMES A DAY    . lovastatin (MEVACOR) 10 MG tablet Take 10 mg by mouth at bedtime.    . metoprolol tartrate (LOPRESSOR) 25 MG tablet Take 25 mg by mouth 2 (two) times daily. Reported on 12/05/2015    . Multiple Vitamin (MULTIVITAMIN WITH MINERALS) TABS tablet Take 1 tablet by mouth daily.    [name]. spironolactone (ALDACTONE) 25 MG tablet Take 25 mg by mouth daily.    . Fluticasone-Salmeterol (ADVAIR DISKUS) 250-50 MCG/DOSE AEPB Inhale 1 puff into the lungs 2 (two) times daily. (Patient  not taking: Reported on 07/20/2018) 1 each 5  . ibuprofen (ADVIL,MOTRIN) 200 MG tablet Take 200 mg by mouth every 6 (six) hours as needed. Reported on 12/05/2015     No current facility-administered medications for this visit.     Past Medical History:  Diagnosis Date  . Asthma   . Cellulitis    ? on Lt leg on antibiotics  . Coronary artery disease   . Diabetes mellitus without complication (HCC)    Type 2, insulin dependent  . DVT of leg (deep venous thrombosis) (HCC)    left  . Dysrhythmia   . GERD (gastroesophageal reflux disease)   . History of atrial fibrillation   . History of CEA (carotid endarterectomy)    left side  . Hypertension   . Injury of leg, left    Lt leg reddened and draining on antibiotics  . Myocardial infarction (HCC)   . Pacemaker   . Parkinson's disease (HCC)   . Seasonal allergies     Past Surgical History:  Procedure Laterality Date  . APPENDECTOMY    . APPLICATION OF A-CELL OF EXTREMITY Left 01/04/2015   Procedure: APPLICATION OF A-CELL  AND VAC ;  Surgeon: Wayland Denislaire Sanger, DO;  Location: [facility];  Service: Plastics;  Laterality: Left;  . CARDIAC CATHETERIZATION    . CAROTID ENDARTERECTOMY Left   .  CORONARY ARTERY BYPASS GRAFT    . HERNIA REPAIR     UHR  . I&D EXTREMITY Left 01/04/2015   Procedure: IRRIGATION AND DEBRIDEMENT OF LOWER LEFT LEG WOUND WITH A CELL AND VAC;  Surgeon: Wayland Denis, DO;  Location: [facility];  Service: Plastics;  Laterality: Left;  . IVC FILTER PLACEMENT ([facility] HX)    . PACEMAKER INSERTION  2012   sss  . PERIPHERAL VASCULAR CATHETERIZATION N/A 11/06/2015   Procedure: IVC Filter Insertion;  Surgeon: Annice Needy, MD;  Location: [facility];  Service: Cardiovascular;  Laterality: N/A;  . PERIPHERAL VASCULAR CATHETERIZATION N/A 01/18/2016   Procedure: IVC Filter Removal;  Surgeon: Annice Needy, MD;  Location: [facility];  Service: Cardiovascular;  Laterality: N/A;  . SKIN  SPLIT GRAFT Left 02/03/2015   Procedure: SKIN GRAFT FROM  LEFT THIGH TO LEFT LEG;  Surgeon: Glenna Fellows, MD;  Location: [facility];  Service: Plastics;  Laterality: Left;  . TOTAL HIP ARTHROPLASTY Right 12/05/2015   Procedure: TOTAL HIP ARTHROPLASTY ANTERIOR APPROACH;  Surgeon: Kennedy Bucker, MD;  Location: [facility] ORS;  Service: Orthopedics;  Laterality: Right;  [name] VASCULAR SURGERY      Social History Social History   Tobacco Use  . Smoking status: Never Smoker  . Smokeless tobacco: Never Used  Substance Use Topics  . Alcohol use: No  . Drug use: No     Family History Family History  Problem Relation Age of Onset  . Diabetes Mother   . Stroke Father   . Deep vein thrombosis Sister      Allergies  Allergen Reactions  . Atorvastatin Other (See Comments)    Muscle aches Other reaction(s): Other (See Comments), Unknown Joint irritation    . Zetia [Ezetimibe]      REVIEW OF SYSTEMS (Negative unless checked)  Constitutional: [x] Weight loss  [] Fever  [] Chills Cardiac: [] Chest pain   [] Chest pressure   [] Palpitations   [] Shortness of breath when laying flat   [] Shortness of breath at rest   [] Shortness of breath with exertion. Vascular:  [] Pain in legs with walking   [] Pain in legs at rest   [] Pain in legs when laying flat   [] Claudication   [] Pain in feet when walking  [] Pain in feet at rest  [] Pain in feet when laying flat   [x] History of DVT   [] Phlebitis   [x] Swelling in legs   [] Varicose veins   [] Non-healing ulcers Pulmonary:   [] Uses home oxygen   [x] Productive cough   [] Hemoptysis   [] Wheeze  [] COPD   [] Asthma Neurologic:  [x] Dizziness  [] Blackouts   [] Seizures   [] History of stroke   [] History of TIA  [] Aphasia   [] Temporary blindness   [] Dysphagia   [] Weakness or numbness in arms   [] Weakness or numbness in legs Musculoskeletal:  [x] Arthritis   [] Joint swelling   [x] Joint pain   [] Low back pain Hematologic:  [] Easy bruising  [] Easy bleeding    [] Hypercoagulable state   [] Anemic  [] Hepatitis Gastrointestinal:  [] Blood in stool   [] Vomiting blood  [x] Gastroesophageal reflux/heartburn   [] Difficulty swallowing. Genitourinary:  [x] Chronic kidney disease   [] Difficult urination  [] Frequent urination  [] Burning with urination   [] Blood in urine Skin:  [] Rashes   [] Ulcers   [] Wounds Psychological:  [] History of anxiety   []  History of major depression.  Physical Examination  Vitals:   07/28/18 0843  BP: (!) 107/56  Pulse: 70  Weight: 188 lb (85.3 kg)  Height: 6\' 5"  (1.956 m)   Body mass index is 22.29 kg/m. Gen: Tall and thin, NAD Head: [place]/AT, No temporalis wasting. Ear/Nose/Throat: Hearing grossly intact, nares w/o erythema or drainage, trachea midline Eyes: Conjunctiva clear. Sclera non-icteric Neck: Supple.  No bruit  Pulmonary:  Good air movement, equal and clear to auscultation bilaterally.  Cardiac: RRR, No JVD Vascular:  Vessel Right Left  Radial Palpable Palpable                               Musculoskeletal: M/S 5/5 throughout.  No deformity or atrophy.  Trace lower extremity edema. Neurologic: CN 2-12 intact. Sensation grossly intact in extremities.  Symmetrical.  Speech is fluent. Motor exam as listed above.  Does have a tremor Psychiatric: Judgment intact, Mood & affect appropriate for pt's clinical situation. Dermatologic: No rashes or ulcers noted.  No cellulitis or open wounds.      CBC Lab Results  Component Value Date   WBC 8.7 01/15/2016   HGB 11.2 (L) 01/15/2016   HCT 34.2 (L) 01/15/2016   MCV 87.3 01/15/2016   PLT 178 01/15/2016    BMET    Component Value Date/Time   NA 138 01/15/2016 0901   NA 136 01/06/2014 0502   K 4.1 01/15/2016 0901   K 3.8 01/06/2014 0502   CL 105 01/15/2016 0901   CL 105 01/06/2014 0502   CO2 28 01/15/2016 0901   CO2 28 01/06/2014 0502   GLUCOSE 182 (H) 01/15/2016 0901   GLUCOSE 147 (H) 01/06/2014 0502   BUN 21 (H) 01/15/2016 0901   BUN 12 01/06/2014  0502   CREATININE 0.74 01/15/2016 0901   CREATININE 0.91 01/06/2014 0502   CALCIUM 8.6 (L) 01/15/2016 0901   CALCIUM 7.9 (L) 01/06/2014 0502   GFRNONAA >60 01/15/2016 0901   GFRNONAA >60 01/06/2014 0502   GFRAA >60 01/15/2016 0901   GFRAA >60 01/06/2014 0502   CrCl cannot be calculated (Patient's most recent lab result is older than the maximum 21 days allowed.).  COAG Lab Results  Component Value Date   INR 1.06 11/24/2015   INR 1.1 01/06/2014    Radiology Dg Chest 2 View  Result Date: 07/20/2018 CLINICAL DATA:  Dyspnea on exertion. History of left pleural effusion. EXAM: CHEST - 2 VIEW COMPARISON:  CT chest 04/28/2018. Single-view of the chest 05/05/2018. FINDINGS: Calcified pleural plaques are again seen. Small left pleural effusion and basilar airspace disease are present. Aeration in the left lung base appears improved. Trace right pleural effusion is noted. No pneumothorax. Heart size is normal. Aortic atherosclerosis is seen. The patient is status post CABG with a pacing device in place. No acute or focal bony abnormality. IMPRESSION: Small left pleural effusion and mild basilar airspace disease. Aeration in the left lung base appears mildly improved compared to the prior examination. Calcified pleural plaques consistent with prior asbestos exposure. Electronically Signed   By: Drusilla Kanner M.D.   On: 07/20/2018 15:43      Assessment/Plan Hypertension blood pressure control important in reducing the progression of atherosclerotic disease. On appropriate oral medications.   Type 2 diabetes mellitus with stage 2 chronic kidney disease, with long-term current use of insulin (HCC) blood glucose control important in reducing the progression of atherosclerotic disease. Also, involved in wound healing. On appropriate medications.   Bilateral carotid artery stenosis His duplex today shows a widely patent left carotid endarterectomy and stenosis that falls in the mild,  less  than 40% range on the right although calcific plaque may mask some higher velocities.  Clearly, this has not significantly progressed.  Continue current medical regimen and recheck in 1 year.    Festus BarrenJason Shalika Arntz, MD  07/28/2018 9:38 AM    This note was created with Dragon medical transcription system.  Any errors from dictation are purely unintentional

## 2018-08-21 ENCOUNTER — Encounter: Payer: Self-pay | Admitting: Cardiovascular Disease

## 2018-08-27 ENCOUNTER — Ambulatory Visit: Payer: Medicare HMO | Admitting: Gastroenterology

## 2018-09-23 ENCOUNTER — Ambulatory Visit: Payer: Medicare HMO | Admitting: Gastroenterology

## 2018-10-09 ENCOUNTER — Other Ambulatory Visit: Payer: Self-pay

## 2018-10-09 ENCOUNTER — Encounter
Admission: RE | Admit: 2018-10-09 | Discharge: 2018-10-09 | Disposition: A | Discharge status: Home / Self Care | Payer: Medicare HMO | Source: Ambulatory Visit | Attending: Cardiology | Admitting: Cardiology

## 2018-10-09 ENCOUNTER — Ambulatory Visit
Admission: RE | Admit: 2018-10-09 | Discharge: 2018-10-09 | Disposition: A | Discharge status: Home / Self Care | Payer: Medicare HMO | Source: Ambulatory Visit | Attending: Cardiology | Admitting: Cardiology

## 2018-10-09 DIAGNOSIS — Z86010 Personal history of colonic polyps: Secondary | ICD-10-CM | POA: Diagnosis not present

## 2018-10-09 DIAGNOSIS — Z8249 Family history of ischemic heart disease and other diseases of the circulatory system: Secondary | ICD-10-CM | POA: Diagnosis not present

## 2018-10-09 DIAGNOSIS — I4892 Unspecified atrial flutter: Secondary | ICD-10-CM | POA: Diagnosis not present

## 2018-10-09 DIAGNOSIS — Z888 Allergy status to other drugs, medicaments and biological substances status: Secondary | ICD-10-CM | POA: Diagnosis not present

## 2018-10-09 DIAGNOSIS — Z96641 Presence of right artificial hip joint: Secondary | ICD-10-CM | POA: Diagnosis not present

## 2018-10-09 DIAGNOSIS — G20 Parkinson's disease: Secondary | ICD-10-CM | POA: Diagnosis not present

## 2018-10-09 DIAGNOSIS — J449 Chronic obstructive pulmonary disease, unspecified: Secondary | ICD-10-CM | POA: Diagnosis not present

## 2018-10-09 DIAGNOSIS — Z794 Long term (current) use of insulin: Secondary | ICD-10-CM | POA: Diagnosis not present

## 2018-10-09 DIAGNOSIS — Z823 Family history of stroke: Secondary | ICD-10-CM | POA: Diagnosis not present

## 2018-10-09 DIAGNOSIS — G4733 Obstructive sleep apnea (adult) (pediatric): Secondary | ICD-10-CM | POA: Diagnosis not present

## 2018-10-09 DIAGNOSIS — I255 Ischemic cardiomyopathy: Secondary | ICD-10-CM | POA: Diagnosis not present

## 2018-10-09 DIAGNOSIS — Z86718 Personal history of other venous thrombosis and embolism: Secondary | ICD-10-CM | POA: Diagnosis not present

## 2018-10-09 DIAGNOSIS — Z01818 Encounter for other preprocedural examination: Secondary | ICD-10-CM | POA: Insufficient documentation

## 2018-10-09 DIAGNOSIS — Z79899 Other long term (current) drug therapy: Secondary | ICD-10-CM | POA: Diagnosis not present

## 2018-10-09 DIAGNOSIS — Z9889 Other specified postprocedural states: Secondary | ICD-10-CM | POA: Diagnosis not present

## 2018-10-09 DIAGNOSIS — Z8673 Personal history of transient ischemic attack (TIA), and cerebral infarction without residual deficits: Secondary | ICD-10-CM | POA: Diagnosis not present

## 2018-10-09 DIAGNOSIS — Z419 Encounter for procedure for purposes other than remedying health state, unspecified: Secondary | ICD-10-CM

## 2018-10-09 DIAGNOSIS — I480 Paroxysmal atrial fibrillation: Secondary | ICD-10-CM | POA: Diagnosis not present

## 2018-10-09 DIAGNOSIS — I495 Sick sinus syndrome: Secondary | ICD-10-CM | POA: Insufficient documentation

## 2018-10-09 DIAGNOSIS — Z950 Presence of cardiac pacemaker: Secondary | ICD-10-CM | POA: Diagnosis not present

## 2018-10-09 DIAGNOSIS — N182 Chronic kidney disease, stage 2 (mild): Secondary | ICD-10-CM | POA: Diagnosis not present

## 2018-10-09 DIAGNOSIS — Z7982 Long term (current) use of aspirin: Secondary | ICD-10-CM | POA: Diagnosis not present

## 2018-10-09 DIAGNOSIS — Z833 Family history of diabetes mellitus: Secondary | ICD-10-CM | POA: Diagnosis not present

## 2018-10-09 DIAGNOSIS — E1122 Type 2 diabetes mellitus with diabetic chronic kidney disease: Secondary | ICD-10-CM | POA: Diagnosis not present

## 2018-10-09 DIAGNOSIS — Z951 Presence of aortocoronary bypass graft: Secondary | ICD-10-CM | POA: Diagnosis not present

## 2018-10-09 DIAGNOSIS — E7801 Familial hypercholesterolemia: Secondary | ICD-10-CM | POA: Diagnosis not present

## 2018-10-09 LAB — BASIC METABOLIC PANEL
Anion gap: 9 (ref 5–15)
BUN: 33 mg/dL — AB (ref 8–23)
CO2: 30 mmol/L (ref 22–32)
CREATININE: 1.03 mg/dL (ref 0.61–1.24)
Calcium: 9.1 mg/dL (ref 8.9–10.3)
Chloride: 99 mmol/L (ref 98–111)
GFR calc Af Amer: >60 mL/min (ref >60)
GLUCOSE: 205 mg/dL — AB (ref 70–99)
POTASSIUM: 4.7 mmol/L (ref 3.5–5.1)
Sodium: 138 mmol/L (ref 135–145)

## 2018-10-09 LAB — CBC
HCT: 39.9 % (ref 39.0–52.0)
Hemoglobin: 13 g/dL (ref 13.0–17.0)
MCH: 31.6 pg (ref 26.0–34.0)
MCHC: 32.6 g/dL (ref 30.0–36.0)
MCV: 96.8 fL (ref 80.0–100.0)
PLATELETS: 190 10*3/uL (ref 150–400)
RBC: 4.12 MIL/uL — AB (ref 4.22–5.81)
RDW: 13.9 % (ref 11.5–15.5)
WBC: 8.1 10*3/uL (ref 4.0–10.5)
nRBC: 0 % (ref 0.0–0.2)

## 2018-10-09 LAB — URINALYSIS, ROUTINE W REFLEX MICROSCOPIC
Bilirubin Urine: NEGATIVE
Glucose, UA: NEGATIVE mg/dL
Hgb urine dipstick: NEGATIVE
Ketones, ur: 5 mg/dL — AB
Leukocytes, UA: NEGATIVE
Nitrite: NEGATIVE
PH: 5 (ref 5.0–8.0)
Protein, ur: NEGATIVE mg/dL
SPECIFIC GRAVITY, URINE: 1.023 (ref 1.005–1.030)

## 2018-10-09 LAB — PROTIME-INR
INR: 1.09
Prothrombin Time: 14 seconds (ref 11.4–15.2)

## 2018-10-09 LAB — APTT: aPTT: 30 seconds (ref 24–36)

## 2018-10-09 NOTE — Patient Instructions (Signed)
Your procedure is scheduled on: Wednesday 10/14/18  Report to DAY SURGERY DEPARTMENT LOCATED ON 2ND FLOOR MEDICAL MALL ENTRANCE. To find out your arrival time please call [phone] between 1PM - 3PM on Tuesday 10/13/18  Remember: Instructions that are not followed completely may result in serious medical risk, up to and including death, or upon the discretion of your surgeon and anesthesiologist your surgery may need to be rescheduled.     _X__ 1. Do not eat food after midnight the night before your procedure.                 No gum chewing or hard candies. You may drink SUGAR FREE clear liquids up to 2 hours                 before you are scheduled to arrive for your surgery- DO NOT drink clear                 liquids within 2 hours of your arrival time.                 __X__2.  On the morning of surgery brush your teeth with toothpaste and water, you may rinse your mouth with mouthwash if you wish.  Do not swallow any toothpaste or mouthwash.     _X__ 3.  No Alcohol for 24 hours before or after surgery.   _X__ 4.  Do Not Smoke or use e-cigarettes For 24 Hours Prior to Your Surgery.                 Do not use any chewable tobacco products for at least 6 hours prior to                 surgery.   __X__ 5.  Notify your doctor if there is any change in your medical condition      (cold, fever, infections).     Do not wear jewelry, make-up, hairpins, clips or nail polish. Do not wear lotions, powders, or perfumes.  Do not shave 48 hours prior to surgery. Men may shave face and neck. Do not bring valuables to the hospital.    [facility] is not responsible for any belongings or valuables.  Contacts, dentures/partials or body piercings may not be worn into surgery. Bring a case for your contacts, glasses or hearing aids, a denture cup will be supplied. Leave your suitcase in the car. After surgery it may be brought to your room. For patients admitted to the hospital, discharge time is  determined by your treatment team.   Patients discharged the day of surgery will not be allowed to drive home.   Please read over the following fact sheets that you were given:   MRSA Information  __X__ Take these medicines the morning of surgery with A SIP OF WATER:     1. budesonide-formoterol (SYMBICORT) 80-4.5 MCG/ACT inhaler  2. carbidopa-levodopa (SINEMET IR) 25-250 MG tablet  3. esomeprazole (NEXIUM) 20 MG capsule  4. metoprolol tartrate (LOPRESSOR) 50 MG tablet     __X__ Use CHG Soap as directed  _ X___ Use inhalers on the day of surgery. Also bring the inhaler with you to the hospital on the morning of surgery.   __X__ Take 1/2 of usual insulin dose the night before surgery. No insulin the morning          of surgery.   __X__ Stop Blood Thinners Coumadin/Plavix/Xarelto/Pleta/Pradaxa/Eliquis/Effient/Aspirin Sunday 10/11/18.  __X__ Stop Anti-inflammatories 7 days before  surgery such as Advil, Ibuprofen, Motrin, BC or Goodies Powder, Naprosyn, Naproxen, Aleve, Aspirin, Meloxicam. May take Tylenol if needed for pain or discomfort.   __X__ Stop all herbal supplements until after surgery.

## 2018-10-14 ENCOUNTER — Ambulatory Visit
Admission: RE | Admit: 2018-10-14 | Discharge: 2018-10-14 | Disposition: A | Discharge status: Home / Self Care | Payer: Medicare HMO | Source: Ambulatory Visit | Attending: Cardiology | Admitting: Cardiology

## 2018-10-14 ENCOUNTER — Encounter: Payer: Self-pay | Admitting: *Deleted

## 2018-10-14 ENCOUNTER — Ambulatory Visit: Payer: Medicare HMO | Admitting: Registered Nurse

## 2018-10-14 ENCOUNTER — Encounter
Admission: RE | Disposition: A | Discharge status: Home / Self Care | Payer: Self-pay | Source: Ambulatory Visit | Attending: Cardiology

## 2018-10-14 ENCOUNTER — Other Ambulatory Visit: Payer: Self-pay

## 2018-10-14 DIAGNOSIS — I255 Ischemic cardiomyopathy: Secondary | ICD-10-CM | POA: Insufficient documentation

## 2018-10-14 DIAGNOSIS — Z79899 Other long term (current) drug therapy: Secondary | ICD-10-CM | POA: Insufficient documentation

## 2018-10-14 DIAGNOSIS — Z888 Allergy status to other drugs, medicaments and biological substances status: Secondary | ICD-10-CM | POA: Insufficient documentation

## 2018-10-14 DIAGNOSIS — Z8673 Personal history of transient ischemic attack (TIA), and cerebral infarction without residual deficits: Secondary | ICD-10-CM | POA: Insufficient documentation

## 2018-10-14 DIAGNOSIS — Z7982 Long term (current) use of aspirin: Secondary | ICD-10-CM | POA: Insufficient documentation

## 2018-10-14 DIAGNOSIS — G20 Parkinson's disease: Secondary | ICD-10-CM | POA: Insufficient documentation

## 2018-10-14 DIAGNOSIS — Z823 Family history of stroke: Secondary | ICD-10-CM | POA: Insufficient documentation

## 2018-10-14 DIAGNOSIS — E7801 Familial hypercholesterolemia: Secondary | ICD-10-CM | POA: Insufficient documentation

## 2018-10-14 DIAGNOSIS — I495 Sick sinus syndrome: Secondary | ICD-10-CM | POA: Diagnosis not present

## 2018-10-14 DIAGNOSIS — Z8249 Family history of ischemic heart disease and other diseases of the circulatory system: Secondary | ICD-10-CM | POA: Insufficient documentation

## 2018-10-14 DIAGNOSIS — Z950 Presence of cardiac pacemaker: Secondary | ICD-10-CM | POA: Insufficient documentation

## 2018-10-14 DIAGNOSIS — Z794 Long term (current) use of insulin: Secondary | ICD-10-CM | POA: Insufficient documentation

## 2018-10-14 DIAGNOSIS — I4892 Unspecified atrial flutter: Secondary | ICD-10-CM | POA: Insufficient documentation

## 2018-10-14 DIAGNOSIS — Z86010 Personal history of colonic polyps: Secondary | ICD-10-CM | POA: Insufficient documentation

## 2018-10-14 DIAGNOSIS — J449 Chronic obstructive pulmonary disease, unspecified: Secondary | ICD-10-CM | POA: Insufficient documentation

## 2018-10-14 DIAGNOSIS — Z96641 Presence of right artificial hip joint: Secondary | ICD-10-CM | POA: Insufficient documentation

## 2018-10-14 DIAGNOSIS — Z951 Presence of aortocoronary bypass graft: Secondary | ICD-10-CM | POA: Insufficient documentation

## 2018-10-14 DIAGNOSIS — G4733 Obstructive sleep apnea (adult) (pediatric): Secondary | ICD-10-CM | POA: Insufficient documentation

## 2018-10-14 DIAGNOSIS — Z9889 Other specified postprocedural states: Secondary | ICD-10-CM | POA: Insufficient documentation

## 2018-10-14 DIAGNOSIS — N182 Chronic kidney disease, stage 2 (mild): Secondary | ICD-10-CM | POA: Insufficient documentation

## 2018-10-14 DIAGNOSIS — Z833 Family history of diabetes mellitus: Secondary | ICD-10-CM | POA: Insufficient documentation

## 2018-10-14 DIAGNOSIS — Z86718 Personal history of other venous thrombosis and embolism: Secondary | ICD-10-CM | POA: Insufficient documentation

## 2018-10-14 DIAGNOSIS — I480 Paroxysmal atrial fibrillation: Secondary | ICD-10-CM | POA: Insufficient documentation

## 2018-10-14 DIAGNOSIS — E1122 Type 2 diabetes mellitus with diabetic chronic kidney disease: Secondary | ICD-10-CM | POA: Insufficient documentation

## 2018-10-14 HISTORY — PX: PACEMAKER INSERTION: SHX728

## 2018-10-14 LAB — GLUCOSE, CAPILLARY
Glucose-Capillary: 139 mg/dL — ABNORMAL HIGH (ref 70–99)
Glucose-Capillary: 128 mg/dL — ABNORMAL HIGH (ref 70–99)

## 2018-10-14 SURGERY — INSERTION, CARDIAC PACEMAKER
Anesthesia: General | Site: Chest | Laterality: Left

## 2018-10-14 MED ORDER — PROPOFOL 500 MG/50ML IV EMUL
INTRAVENOUS | Status: DC | PRN
  Administered 2018-10-14: 100 ug/kg/min via INTRAVENOUS

## 2018-10-14 MED ORDER — LIDOCAINE HCL (CARDIAC) PF 100 MG/5ML IV SOSY
PREFILLED_SYRINGE | INTRAVENOUS | Status: DC | PRN
  Administered 2018-10-14: 80 mg via INTRATRACHEAL

## 2018-10-14 MED ORDER — CEFAZOLIN SODIUM-DEXTROSE 1-4 GM/50ML-% IV SOLN
INTRAVENOUS | Status: AC
  Filled 2018-10-14: qty 50

## 2018-10-14 MED ORDER — MIDAZOLAM HCL 2 MG/2ML IJ SOLN
INTRAMUSCULAR | Status: AC
  Filled 2018-10-14: qty 2

## 2018-10-14 MED ORDER — LIDOCAINE HCL (PF) 2 % IJ SOLN
INTRAMUSCULAR | Status: AC
  Filled 2018-10-14: qty 10

## 2018-10-14 MED ORDER — CEPHALEXIN 250 MG PO CAPS: 500.0000 mg | ORAL_CAPSULE | Freq: Two times a day (BID) | ORAL | 0 refills | Status: DC

## 2018-10-14 MED ORDER — CEFAZOLIN (ANCEF) 1 G IV SOLR
1.0000 g | INTRAVENOUS | Status: AC
  Administered 2018-10-14: 1 g
  Filled 2018-10-14: qty 1

## 2018-10-14 MED ORDER — FENTANYL CITRATE (PF) 100 MCG/2ML IJ SOLN
INTRAMUSCULAR | Status: DC | PRN
  Administered 2018-10-14: 50 ug via INTRAVENOUS
  Administered 2018-10-14 (×2): 25 ug via INTRAVENOUS

## 2018-10-14 MED ORDER — FENTANYL CITRATE (PF) 100 MCG/2ML IJ SOLN: 25.0000 ug | INTRAMUSCULAR | Status: DC | PRN

## 2018-10-14 MED ORDER — FENTANYL CITRATE (PF) 100 MCG/2ML IJ SOLN
INTRAMUSCULAR | Status: AC
  Filled 2018-10-14: qty 2

## 2018-10-14 MED ORDER — SODIUM CHLORIDE 0.9 % IV SOLN
INTRAVENOUS | Status: DC
  Administered 2018-10-14 (×2): via INTRAVENOUS

## 2018-10-14 MED ORDER — PROPOFOL 500 MG/50ML IV EMUL
INTRAVENOUS | Status: AC
  Filled 2018-10-14: qty 50

## 2018-10-14 MED ORDER — ONDANSETRON HCL 4 MG/2ML IJ SOLN: 4.0000 mg | Freq: Once | INTRAMUSCULAR | Status: DC | PRN

## 2018-10-14 MED ORDER — SODIUM CHLORIDE 0.9 % IV SOLN
Freq: Once | INTRAVENOUS | Status: DC
  Filled 2018-10-14: qty 2

## 2018-10-14 MED ORDER — PROPOFOL 10 MG/ML IV BOLUS
INTRAVENOUS | Status: DC | PRN
  Administered 2018-10-14: 40 mg via INTRAVENOUS

## 2018-10-14 MED ORDER — MIDAZOLAM HCL 2 MG/2ML IJ SOLN
INTRAMUSCULAR | Status: DC | PRN
  Administered 2018-10-14 (×2): 0.5 mg via INTRAVENOUS

## 2018-10-14 MED ORDER — LIDOCAINE 1 % OPTIME INJ - NO CHARGE
INTRAMUSCULAR | Status: DC | PRN
  Administered 2018-10-14: 20 mL

## 2018-10-14 MED ORDER — SODIUM CHLORIDE 0.9 % IV SOLN
INTRAVENOUS | Status: DC | PRN
  Administered 2018-10-14: 500 mL

## 2018-10-14 SURGICAL SUPPLY — 35 items
BAG DECANTER FOR FLEXI CONT (MISCELLANEOUS) ×2 IMPLANT
BRUSH SCRUB EZ  4% CHG (MISCELLANEOUS) ×1
BRUSH SCRUB EZ 4% CHG (MISCELLANEOUS) ×1 IMPLANT
CABLE SURG 12 DISP A/V CHANNEL (MISCELLANEOUS) ×2 IMPLANT
CANISTER SUCT 1200ML W/VALVE (MISCELLANEOUS) ×2 IMPLANT
CHLORAPREP W/TINT 26ML (MISCELLANEOUS) ×2 IMPLANT
COVER LIGHT HANDLE STERIS (MISCELLANEOUS) ×4 IMPLANT
COVER MAYO STAND STRL (DRAPES) ×2 IMPLANT
COVER WAND RF STERILE (DRAPES) ×2 IMPLANT
DRAPE C-ARM XRAY 36X54 (DRAPES) ×2 IMPLANT
DRSG TEGADERM 4X4.75 (GAUZE/BANDAGES/DRESSINGS) ×2 IMPLANT
DRSG TELFA 4X3 1S NADH ST (GAUZE/BANDAGES/DRESSINGS) ×2 IMPLANT
ELECT REM PT RETURN 9FT ADLT (ELECTROSURGICAL) ×2
ELECTRODE REM PT RTRN 9FT ADLT (ELECTROSURGICAL) ×1 IMPLANT
GLOVE BIO SURGEON STRL SZ7.5 (GLOVE) ×2 IMPLANT
GLOVE BIO SURGEON STRL SZ8 (GLOVE) ×2 IMPLANT
GOWN STRL REUS W/ TWL LRG LVL3 (GOWN DISPOSABLE) ×1 IMPLANT
GOWN STRL REUS W/ TWL XL LVL3 (GOWN DISPOSABLE) ×1 IMPLANT
GOWN STRL REUS W/TWL LRG LVL3 (GOWN DISPOSABLE) ×1
GOWN STRL REUS W/TWL XL LVL3 (GOWN DISPOSABLE) ×1
IMMOBILIZER SHDR MD LX WHT (SOFTGOODS) IMPLANT
IMMOBILIZER SHDR XL LX WHT (SOFTGOODS) IMPLANT
INTRO PACEMKR SHEATH II 7FR (MISCELLANEOUS) ×2
INTRODUCER PACEMKR SHTH II 7FR (MISCELLANEOUS) ×1 IMPLANT
IPG PACE AZUR XT DR MRI W1DR01 (Pacemaker) ×1 IMPLANT
IV NS 500ML (IV SOLUTION)
IV NS 500ML BAXH (IV SOLUTION) IMPLANT
KIT TURNOVER KIT A (KITS) ×2 IMPLANT
KIT WRENCH (KITS) ×2 IMPLANT
LABEL OR SOLS (LABEL) ×2 IMPLANT
MARKER SKIN DUAL TIP RULER LAB (MISCELLANEOUS) ×2 IMPLANT
PACE AZURE XT DR MRI W1DR01 (Pacemaker) ×2 IMPLANT
PACK PACE INSERTION (MISCELLANEOUS) ×2 IMPLANT
PAD ONESTEP ZOLL R SERIES ADT (MISCELLANEOUS) ×2 IMPLANT
SUT SILK 0 SH 30 (SUTURE) ×6 IMPLANT

## 2018-10-14 NOTE — Interval H&P Note (Signed)
History and Physical Interval Note:  10/14/2018 12:00 PM  Ian [name]  has presented today for surgery, with the diagnosis of sinotrial node dysfunction  The various methods of treatment have been discussed with the patient and family. After consideration of risks, benefits and other options for treatment, the patient has consented to  Procedure(s): PACEMAKER CHANGE OUT (Left) as a surgical intervention .  The patient's history has been reviewed, patient examined, no change in status, stable for surgery.  I have reviewed the patient's chart and labs.  Questions were answered to the patient's satisfaction.     Nellie Chevalier [employer]

## 2018-10-14 NOTE — Anesthesia Post-op Follow-up Note (Signed)
Anesthesia QCDR form completed.        

## 2018-10-14 NOTE — Transfer of Care (Signed)
Immediate Anesthesia Transfer of Care Note  Patient: Ian [name]  Procedure(s) Performed: PACEMAKER CHANGE OUT (Left Chest)  Patient Location: PACU  Anesthesia Type:General  Level of Consciousness: awake  Airway & Oxygen Therapy: Patient Spontanous Breathing  Post-op Assessment: Report given to RN  Post vital signs: stable  Last Vitals:  Vitals Value Taken Time  BP 118/71 10/14/2018  1:09 PM  Temp 36.4 C 10/14/2018  1:09 PM  Pulse 70 10/14/2018  1:13 PM  Resp 22 10/14/2018  1:13 PM  SpO2 100 % 10/14/2018  1:13 PM  Vitals shown include unvalidated device data.  Last Pain:  Vitals:   10/14/18 1309  TempSrc:   PainSc: (P) 0-No pain         Complications: No apparent anesthesia complications

## 2018-10-14 NOTE — Op Note (Signed)
[facility]   10/14/2018                     1:12 PM  PATIENT:  Ian [name]    PRE-OPERATIVE DIAGNOSIS:  sinotrial node dysfunction  POST-OPERATIVE DIAGNOSIS:  Same  PROCEDURE:  PACEMAKER CHANGE OUT  SURGEON:  Marcina Millard, MD    ANESTHESIA:     PREOPERATIVE INDICATIONS:  Ian [name] is a  72 y.o. male with a diagnosis of sinotrial node dysfunction who failed conservative measures and elected for surgical management.    The risks benefits and alternatives were discussed with the patient preoperatively including but not limited to the risks of infection, bleeding, cardiopulmonary complications, the need for revision surgery, among others, and the patient was willing to proceed.   OPERATIVE PROCEDURE: The patient was brought to the operating room in a fasting state.  The left pectoral region was prepped and draped in usual sterile manner.  Anesthesia was obtained 1% lidocaine locally.  A 6 cm incision was performed to the left pectoral region.  The existing pacemaker generator was retrieved by electrocautery and blunt dissection.  Leads were disconnected and connected to a new MRI compatible dual-chamber rate responsive pacemaker generator ( Medtronic [id number] ).  The pacemaker pocket was irrigated with gentamicin solution.  The new pacemaker generator was positioned into the pocket and the pocket was closed with 2-0 and 4-0 Vicryl, respectively.  Steri-Strips compressive dressing were applied.  Postprocedural interrogation revealed appropriate atrial and ventricular pacing and sensing thresholds.  There were no periprocedural complications.

## 2018-10-14 NOTE — Anesthesia Postprocedure Evaluation (Signed)
Anesthesia Post Note  Patient: Ian [name]  Procedure(s) Performed: PACEMAKER CHANGE OUT (Left Chest)  Patient location during evaluation: PACU Anesthesia Type: General Level of consciousness: awake and alert and oriented Pain management: pain level controlled Vital Signs Assessment: post-procedure vital signs reviewed and stable Respiratory status: spontaneous breathing, nonlabored ventilation and respiratory function stable Cardiovascular status: blood pressure returned to baseline and stable Postop Assessment: no signs of nausea or vomiting Anesthetic complications: no     Last Vitals:  Vitals:   10/14/18 1349 10/14/18 1411  BP: 125/75 (!) 147/70  Pulse: 70 69  Resp: 16 16  Temp: (!) 36.1 C   SpO2: 100% 100%    Last Pain:  Vitals:   10/14/18 1411  TempSrc:   PainSc: 0-No pain                 Orvile Corona
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
## 2018-10-14 NOTE — Anesthesia Preprocedure Evaluation (Signed)
Anesthesia Evaluation  Patient identified by MRN, date of birth, ID band Patient awake    Reviewed: Allergy & Precautions, NPO status , Patient's Chart, lab work & pertinent test results  History of Anesthesia Complications Negative for: history of anesthetic complications  Airway Mallampati: II  TM Distance: >3 FB Neck ROM: Full    Dental no notable dental hx.    Pulmonary asthma , COPD,  COPD inhaler,    breath sounds clear to auscultation- rhonchi (-) wheezing      Cardiovascular hypertension, + CAD, + Past MI and + CABG  + dysrhythmias (s/p afib ablation) Atrial Fibrillation + pacemaker  Rhythm:Regular Rate:Normal - Systolic murmurs and - Diastolic murmurs    Neuro/Psych negative neurological ROS  negative psych ROS   GI/Hepatic Neg liver ROS, GERD  ,  Endo/Other  diabetes, Insulin Dependent  Renal/GU negative Renal ROS     Musculoskeletal  (+) Arthritis ,   Abdominal (+) - obese,   Peds  Hematology negative hematology ROS (+)   Anesthesia Other Findings Past Medical History: No date: Asthma No date: Cellulitis     Comment:  ? on Lt leg on antibiotics No date: Coronary artery disease No date: Diabetes mellitus without complication (HCC)     Comment:  Type 2, insulin dependent No date: DVT of leg (deep venous thrombosis) (HCC)     Comment:  left No date: Dysrhythmia No date: GERD (gastroesophageal reflux disease) No date: History of atrial fibrillation No date: History of CEA (carotid endarterectomy)     Comment:  left side No date: Hypertension No date: Injury of leg, left     Comment:  Lt leg reddened and draining on antibiotics No date: Myocardial infarction (HCC) No date: Pacemaker No date: Parkinson's disease (HCC) No date: Seasonal allergies   Reproductive/Obstetrics                             Anesthesia Physical Anesthesia Plan  ASA: III  Anesthesia Plan:  General   Post-op Pain Management:    Induction: Intravenous  PONV Risk Score and Plan: 1 and Propofol infusion  Airway Management Planned: Natural Airway  Additional Equipment:   Intra-op Plan:   Post-operative Plan:   Informed Consent: I have reviewed the patients History and Physical, chart, labs and discussed the procedure including the risks, benefits and alternatives for the proposed anesthesia with the patient or authorized representative who has indicated his/her understanding and acceptance.   Dental advisory given  Plan Discussed with: CRNA and Anesthesiologist  Anesthesia Plan Comments:         Anesthesia Quick Evaluation

## 2018-10-14 NOTE — Discharge Instructions (Signed)
Remove outer bandage 10/15/2018.  Leave Steri-Strips on.  AMBULATORY SURGERY  DISCHARGE INSTRUCTIONS   1) The drugs that you were given will stay in your system until tomorrow so for the next 24 hours you should not:  A) Drive an automobile B) Make any legal decisions C) Drink any alcoholic beverage   2) You may resume regular meals tomorrow.  Today it is better to start with liquids and gradually work up to solid foods.  You may eat anything you prefer, but it is better to start with liquids, then soup and crackers, and gradually work up to solid foods.   3) Please notify your doctor immediately if you have any unusual bleeding, trouble breathing, redness and pain at the surgery site, drainage, fever, or pain not relieved by medication.    4) Additional Instructions:        Please contact your physician with any problems or Same Day Surgery at [phone], Monday through Friday 6 am to 4 pm, or [place] at [facility] number at [phone].

## 2018-10-15 ENCOUNTER — Encounter: Payer: Self-pay | Admitting: Cardiology

## 2018-11-19 DIAGNOSIS — I495 Sick sinus syndrome: Secondary | ICD-10-CM | POA: Insufficient documentation

## 2018-11-19 DIAGNOSIS — I5022 Chronic systolic (congestive) heart failure: Secondary | ICD-10-CM | POA: Insufficient documentation

## 2019-02-03 NOTE — Progress Notes (Signed)
[facility]     Assessment and Plan:  Pleural effusion with pleural calcifications, pulmonary asbestosis. - s/p right thoracentesis negative, will continue to monitor and repeat if needed.  - Left-sided mildly loculated pleural effusion, he has prominent weight loss.  Most recent chest x-ray shows that pleural effusion was stable.  Will repeat today, if continues to be stable can follow-up on an as-needed basis.  Respiratory muscle weakness with Parkinson's disease, pulmonary fibrosis. - Patient has mild  fibrotic changes, I doubt that these are the major contributor to his mild exertional dyspnea. - Patient has restrictive lung disease, respiratory muscle weakness, likely secondary to Parkinson's disease.  Dysphagia. - Difficulty swallowing with occasional episodes of aspiration. - s/p swallow eval with no evidence of aspiration.   Chronic rhinitis. - Continue Flonase.  Orders Placed This Encounter  Procedures  . DG Chest 2 View   Return if symptoms worsen or fail to improve.    Date: 02/03/2019  MRN# [id number] Ian [name] [birth year]    Ian [name] is a 73 y.o. old male seen in consultation for chief complaint of:    No chief complaint on file.   HPI:   The patient is a 73 year old male with a history significant for atrial fibrillation, lower extremity DVT, COPD, OSA, ischemic cardiomyopathy, Parkinson's disease, status post IVC placement and subsequent retrieval.  He has had a swallowing evaluation which showed disorganized oral swallowing without evidence of aspiration. At last visit it was noted the patient had a significant 40 pound weight loss, with a mildly loculated left pleural effusion with pleural calcification on the right.  He was asked to return in approximately 1 month with repeat chest x-ray to see if the effusion had reaccumulated but not had a chest x-ray done at last visit so this was reordered.   Since his last visit he  feels that his breathing is doing well, he has no new complaints. Occasional cough, no hemoptysis. Denies reflux.    **Chest x-ray 10/09/2018>> chronic small left pleural effusion.  Scattered infiltrates/interstitial changes.  Not significantly changed from previous.  **PFT 05/21/2018>> FVC is 50% predicted, FEV1 is 54% predicted, ratio is 85%.  There is no reversibility with bronchodilator therapy.  Flow volume loop shows restriction.  TLC is 62%, vital capacity is 50% DLCO 57%.  PI max is 22, PE max is 31. - Overall this test shows moderate to severe restrictive lung disease.  Significant respiratory muscle weakness with a maximal inspiratory force of 22. **Left thoracentesis 05/05/2018>> 300 cc of blood-tinged fluid was drained, results were suboptimal for flow cytometry. **CT chest 04/28/2018>> pulmonary arterial and left atrial enlargement consistent with pulmonary arterial and pulmonary venous hypertension minimal mediastinal lymphadenopathy, there is a mildly loculated effusion in the left base with compressive atelectasis, there is pleural calcifications on the right, there are interstitial changes peripherally more severe in the bases bilaterally.  Pleural effusion was not seen on previous CT AP on 01/15/2016.  **06/01/18 Swallow Eval>>  ASSESSMENT: This 73 year old man; with Parkinson's disease and concern for aspiration secondary interstitial changes seen on CT chest; is presenting with mild oropharyngeal dysphagia characterized by slow, disorganized oral management, delayed pharyngeal swallow initiation, and decreased pharyngeal pressure generation with trace-to-mild pharyngeal residue.  There is no observed laryngeal penetration or tracheal aspiration.  The patient does not appear to be at significant risk for ongoing / chronic prandial aspiration.  PLAN/RECOMMENDATIONS:  A. Diet: Usual/regular diet              B. Swallowing Precautions: reduce distractions while eating and  drinking              C. Recommended consultation to: follow up with MDs as recommended              D. Therapy recommendations: speech therapy is not indicated at this time.  Would recommend efhigh effort/high intensity vocal exercises (such as [id number]-[id number]) if patient becomes more hyophonic.                 E. Results and recommendations were discussed with the patient and his son immediately following the study and the final report routed to the referring MD.    Oropharyngeal dysphagia - Plan: DG OP Swallowing Func-Medicare/Speech Path, DG OP Swallowing Func-Medicare/Speech Path    Medication:    Current Outpatient Medications:  .  aspirin 81 MG tablet, Take 81 mg by mouth daily., Disp: , Rfl:  .  budesonide-formoterol (SYMBICORT) 80-4.5 MCG/ACT inhaler, Inhale 2 puffs into the lungs daily as needed (wheezing)., Disp: , Rfl:  .  carbidopa-levodopa (SINEMET CR) 50-200 MG tablet, Take 1 tablet by mouth at bedtime., Disp: , Rfl:  .  carbidopa-levodopa (SINEMET IR) 25-250 MG tablet, Take 1.5 tablets by mouth 3 (three) times daily. , Disp: , Rfl:  .  cephALEXin (KEFLEX) 250 MG capsule, Take 2 capsules (500 mg total) by mouth 2 (two) times daily., Disp: 14 capsule, Rfl: 0 .  esomeprazole (NEXIUM) 20 MG capsule, Take 20 mg by mouth daily at 12 noon., Disp: , Rfl:  .  insulin aspart (NOVOLOG FLEXPEN) 100 UNIT/ML FlexPen, Inject 4 Units into the skin 3 (three) times daily with meals., Disp: , Rfl:  .  insulin detemir (LEVEMIR) 100 UNIT/ML injection, Inject 24 Units into the skin daily. , Disp: , Rfl:  .  Insulin Pen Needle (BD PEN NEEDLE NANO U/F) 32G X 4 MM MISC, USE AS DIRECTED 4 TIMES A DAY, Disp: , Rfl:  .  lovastatin (MEVACOR) 10 MG tablet, Take 10 mg by mouth at bedtime., Disp: , Rfl:  .  metoprolol tartrate (LOPRESSOR) 50 MG tablet, Take 50 mg by mouth 2 (two) times daily. , Disp: , Rfl:  .  Multiple Vitamin (MULTIVITAMIN WITH MINERALS) TABS tablet, Take 1 tablet by mouth daily., Disp:  , Rfl:  .  spironolactone (ALDACTONE) 25 MG tablet, Take 25 mg by mouth daily., Disp: , Rfl:    Allergies:  Atorvastatin and Zetia [ezetimibe]     LABORATORY PANEL:   CBC No results for input(s): WBC, HGB, HCT, PLT in the last 168 hours. ------------------------------------------------------------------------------------------------------------------  Chemistries  No results for input(s): NA, K, CL, CO2, GLUCOSE, BUN, CREATININE, CALCIUM, MG, AST, ALT, ALKPHOS, BILITOT in the last 168 hours.  Invalid input(s): GFRCGP ------------------------------------------------------------------------------------------------------------------  Cardiac Enzymes No results for input(s): TROPONINI in the last 168 hours. ------------------------------------------------------------  RADIOLOGY:  No results found.     Thank  you for the consultation and for allowing [facility] to assist in the care of your patient. Our recommendations are noted above.  Please contact us if we can be of further service.   Wells Guiles, M.D., F.C.C.P.  Board Certified in Internal Medicine, Pulmonary Medicine, Critical Care Medicine, and Sleep Medicine.  [facility] Office Number: [phone]

## 2019-02-04 ENCOUNTER — Ambulatory Visit
Admission: RE | Admit: 2019-02-04 | Discharge: 2019-02-04 | Disposition: A | Discharge status: Home / Self Care | Payer: Medicare HMO | Source: Ambulatory Visit | Attending: Internal Medicine | Admitting: Internal Medicine

## 2019-02-04 ENCOUNTER — Encounter: Payer: Self-pay | Admitting: Internal Medicine

## 2019-02-04 ENCOUNTER — Ambulatory Visit: Payer: Medicare HMO | Admitting: Internal Medicine

## 2019-02-04 ENCOUNTER — Ambulatory Visit
Admission: RE | Admit: 2019-02-04 | Discharge: 2019-02-04 | Disposition: A | Discharge status: Home / Self Care | Payer: Medicare HMO | Attending: Internal Medicine | Admitting: Internal Medicine

## 2019-02-04 VITALS — BP 110/64 | HR 91 | Resp 16 | Ht 78.0 in | Wt 189.0 lb

## 2019-02-04 DIAGNOSIS — R0609 Other forms of dyspnea: Secondary | ICD-10-CM

## 2019-02-04 DIAGNOSIS — J90 Pleural effusion, not elsewhere classified: Secondary | ICD-10-CM | POA: Insufficient documentation

## 2019-02-04 NOTE — Patient Instructions (Signed)
Repeat CXR today 

## 2019-08-03 ENCOUNTER — Ambulatory Visit (INDEPENDENT_AMBULATORY_CARE_PROVIDER_SITE_OTHER): Payer: Medicare HMO | Admitting: Vascular Surgery

## 2019-08-03 ENCOUNTER — Encounter (INDEPENDENT_AMBULATORY_CARE_PROVIDER_SITE_OTHER): Payer: Medicare HMO

## 2019-08-13 ENCOUNTER — Encounter (INDEPENDENT_AMBULATORY_CARE_PROVIDER_SITE_OTHER): Payer: Self-pay | Admitting: Vascular Surgery

## 2019-08-13 ENCOUNTER — Other Ambulatory Visit: Payer: Self-pay

## 2019-08-13 ENCOUNTER — Ambulatory Visit (INDEPENDENT_AMBULATORY_CARE_PROVIDER_SITE_OTHER): Payer: Medicare HMO | Admitting: Vascular Surgery

## 2019-08-13 ENCOUNTER — Ambulatory Visit (INDEPENDENT_AMBULATORY_CARE_PROVIDER_SITE_OTHER): Payer: Medicare HMO

## 2019-08-13 VITALS — BP 113/63 | HR 83 | Resp 16 | Ht 77.0 in | Wt 190.8 lb

## 2019-08-13 DIAGNOSIS — I6523 Occlusion and stenosis of bilateral carotid arteries: Secondary | ICD-10-CM | POA: Diagnosis not present

## 2019-08-13 DIAGNOSIS — E1122 Type 2 diabetes mellitus with diabetic chronic kidney disease: Secondary | ICD-10-CM | POA: Diagnosis not present

## 2019-08-13 DIAGNOSIS — I10 Essential (primary) hypertension: Secondary | ICD-10-CM | POA: Diagnosis not present

## 2019-08-13 DIAGNOSIS — Z794 Long term (current) use of insulin: Secondary | ICD-10-CM

## 2019-08-13 DIAGNOSIS — N182 Chronic kidney disease, stage 2 (mild): Secondary | ICD-10-CM | POA: Diagnosis not present

## 2019-08-13 NOTE — Progress Notes (Signed)
MRN : [id number]  Ian [name] is a 73 y.o. (1946-09-17) male who presents with chief complaint of  Chief Complaint  Patient presents with  . Follow-up    ultrasound follow up  .  History of Present Illness: Patient returns in follow-up today of his carotid disease with his wife who is taking excellent care of him.  He reports no major changes or issues since his last visit.  He has not had any focal neurologic deficits.  He is several years status post left carotid endarterectomy.  Duplex today shows a patent left carotid endarterectomy and right carotid artery velocities which would fall just into the 40 to 59% range.  Current Outpatient Medications  Medication Sig Dispense Refill  . aspirin 81 MG tablet Take 81 mg by mouth daily.    . budesonide-formoterol (SYMBICORT) 80-4.5 MCG/ACT inhaler Inhale 2 puffs into the lungs daily as needed (wheezing).    . carbidopa-levodopa (SINEMET CR) 50-200 MG tablet Take 1 tablet by mouth at bedtime.    . carbidopa-levodopa (SINEMET IR) 25-250 MG tablet Take 1.5 tablets by mouth 3 (three) times daily.     [name] esomeprazole (NEXIUM) 20 MG capsule Take 20 mg by mouth daily at 12 noon.    . insulin aspart (NOVOLOG FLEXPEN) 100 UNIT/ML FlexPen Inject 4 Units into the skin 3 (three) times daily with meals.    . insulin detemir (LEVEMIR) 100 UNIT/ML injection Inject 24 Units into the skin daily.     . Insulin Pen Needle (BD PEN NEEDLE NANO U/F) 32G X 4 MM MISC USE AS DIRECTED 4 TIMES A DAY    . lovastatin (MEVACOR) 10 MG tablet Take 10 mg by mouth at bedtime.    . metoprolol tartrate (LOPRESSOR) 50 MG tablet Take 50 mg by mouth at bedtime.     . Multiple Vitamin (MULTIVITAMIN WITH MINERALS) TABS tablet Take 1 tablet by mouth daily.    [name] spironolactone (ALDACTONE) 25 MG tablet Take 25 mg by mouth daily.     No current facility-administered medications for this visit.     Past Medical History:  Diagnosis Date  . Asthma   . Cellulitis    ? on Lt leg  on antibiotics  . Coronary artery disease   . Diabetes mellitus without complication (HCC)    Type 2, insulin dependent  . DVT of leg (deep venous thrombosis) (HCC)    left  . Dysrhythmia   . GERD (gastroesophageal reflux disease)   . History of atrial fibrillation   . History of CEA (carotid endarterectomy)    left side  . Hypertension   . Injury of leg, left    Lt leg reddened and draining on antibiotics  . Myocardial infarction (HCC)   . Pacemaker   . Parkinson's disease (HCC)   . Seasonal allergies     Past Surgical History:  Procedure Laterality Date  . APPENDECTOMY    . APPLICATION OF A-CELL OF EXTREMITY Left 01/04/2015   Procedure: APPLICATION OF A-CELL  AND VAC ;  Surgeon: Wayland Denis, DO;  Location: [facility];  Service: Plastics;  Laterality: Left;  . CARDIAC CATHETERIZATION    . CAROTID ENDARTERECTOMY Left   . COLONOSCOPY WITH PROPOFOL    . CORONARY ARTERY BYPASS GRAFT    . HERNIA REPAIR     UHR  . I&D EXTREMITY Left 01/04/2015   Procedure: IRRIGATION AND DEBRIDEMENT OF LOWER LEFT LEG WOUND WITH A CELL AND VAC;  Surgeon: Wayland Denis, DO;  Location: [place];  Service: Plastics;  Laterality: Left;  . IVC FILTER PLACEMENT ([facility] HX)    . PACEMAKER INSERTION  2012   sss  . PACEMAKER INSERTION Left 10/14/2018   Procedure: PACEMAKER CHANGE OUT;  Surgeon: Isaias Cowman, MD;  Location: [facility];  Service: Cardiovascular;  Laterality: Left;  . PERIPHERAL VASCULAR CATHETERIZATION N/A 11/06/2015   Procedure: IVC Filter Insertion;  Surgeon: Algernon Huxley, MD;  Location: [place] [facility];  Service: Cardiovascular;  Laterality: N/A;  . PERIPHERAL VASCULAR CATHETERIZATION N/A 01/18/2016   Procedure: IVC Filter Removal;  Surgeon: Algernon Huxley, MD;  Location: [place] [facility];  Service: Cardiovascular;  Laterality: N/A;  . SKIN SPLIT GRAFT Left 02/03/2015   Procedure: SKIN GRAFT FROM  LEFT THIGH TO LEFT LEG;  Surgeon: Irene Limbo,  MD;  Location: [place];  Service: Plastics;  Laterality: Left;  . TOTAL HIP ARTHROPLASTY Right 12/05/2015   Procedure: TOTAL HIP ARTHROPLASTY ANTERIOR APPROACH;  Surgeon: Hessie Knows, MD;  Location: [facility];  Service: Orthopedics;  Laterality: Right;  [name] VASCULAR SURGERY     Social History       Tobacco Use  . Smoking status: Never Smoker  . Smokeless tobacco: Never Used  Substance Use Topics  . Alcohol use: No  . Drug use: No     Family History      Family History  Problem Relation Age of Onset  . Diabetes Mother   . Stroke Father   . Deep vein thrombosis Sister           Allergies  Allergen Reactions  . Atorvastatin Other (See Comments)    Muscle aches Other reaction(s): Other (See Comments), Unknown Joint irritation    . Zetia [Ezetimibe]      REVIEW OF SYSTEMS (Negative unless checked)  Constitutional: [x] ?Weight loss  [] ?Fever  [] ?Chills Cardiac: [] ?Chest pain   [] ?Chest pressure   [] ?Palpitations   [] ?Shortness of breath when laying flat   [] ?Shortness of breath at rest   [] ?Shortness of breath with exertion. Vascular:  [] ?Pain in legs with walking   [] ?Pain in legs at rest   [] ?Pain in legs when laying flat   [] ?Claudication   [] ?Pain in feet when walking  [] ?Pain in feet at rest  [] ?Pain in feet when laying flat   [x] ?History of DVT   [] ?Phlebitis   [x] ?Swelling in legs   [] ?Varicose veins   [] ?Non-healing ulcers Pulmonary:   [] ?Uses home oxygen   [x] ?Productive cough   [] ?Hemoptysis   [] ?Wheeze  [] ?COPD   [] ?Asthma Neurologic:  [x] ?Dizziness  [] ?Blackouts   [] ?Seizures   [] ?History of stroke   [] ?History of TIA  [] ?Aphasia   [] ?Temporary blindness   [] ?Dysphagia   [] ?Weakness or numbness in arms   [] ?Weakness or numbness in legs Musculoskeletal:  [x] ?Arthritis   [] ?Joint swelling   [x] ?Joint pain   [] ?Low back pain Hematologic:  [] ?Easy bruising  [] ?Easy bleeding   [] ?Hypercoagulable state   [] ?Anemic  [] ?Hepatitis  Gastrointestinal:  [] ?Blood in stool   [] ?Vomiting blood  [x] ?Gastroesophageal reflux/heartburn   [] ?Difficulty swallowing. Genitourinary:  [x] ?Chronic kidney disease   [] ?Difficult urination  [] ?Frequent urination  [] ?Burning with urination   [] ?Blood in urine Skin:  [] ?Rashes   [] ?Ulcers   [] ?Wounds Psychological:  [] ?History of anxiety   [] ? History of major depression.    Physical Examination  Vitals:   08/13/19 0827  BP: 113/63  Pulse: 83  Resp: 16  Weight: 190 lb 12.8 oz (86.5  kg)  Height: 6\' 5"  (1.956 m)   Body mass index is 22.63 kg/m. Gen:  WD/WN, NAD Head: [place]/AT, No temporalis wasting. Ear/Nose/Throat: Hearing grossly intact, nares w/o erythema or drainage, trachea midline Eyes: Conjunctiva clear. Sclera non-icteric Neck: Supple.  No bruit  Pulmonary:  Good air movement, equal and clear to auscultation bilaterally.  Cardiac: irregular Vascular:  Vessel Right Left  Radial Palpable Palpable               Musculoskeletal: M/S 5/5 throughout.  No deformity or atrophy. Trace LE edema. Neurologic: CN 2-12 intact. Sensation grossly intact in extremities. tremor present Psychiatric: Judgment intact, Mood & affect appropriate for pt's clinical situation. Dermatologic: No rashes or ulcers noted.  No cellulitis or open wounds. Lymph : No Cervical, Axillary, or Inguinal lymphadenopathy.     CBC Lab Results  Component Value Date   WBC 8.1 10/09/2018   HGB 13.0 10/09/2018   HCT 39.9 10/09/2018   MCV 96.8 10/09/2018   PLT 190 10/09/2018    BMET    Component Value Date/Time   NA 138 10/09/2018 1500   NA 136 01/06/2014 0502   K 4.7 10/09/2018 1500   K 3.8 01/06/2014 0502   CL 99 10/09/2018 1500   CL 105 01/06/2014 0502   CO2 30 10/09/2018 1500   CO2 28 01/06/2014 0502   GLUCOSE 205 (H) 10/09/2018 1500   GLUCOSE 147 (H) 01/06/2014 0502   BUN 33 (H) 10/09/2018 1500   BUN 12 01/06/2014 0502   CREATININE 1.03 10/09/2018 1500   CREATININE 0.91 01/06/2014 0502    CALCIUM 9.1 10/09/2018 1500   CALCIUM 7.9 (L) 01/06/2014 0502   GFRNONAA >60 10/09/2018 1500   GFRNONAA >60 01/06/2014 0502   GFRAA >60 10/09/2018 1500   GFRAA >60 01/06/2014 0502   CrCl cannot be calculated (Patient's most recent lab result is older than the maximum 21 days allowed.).  COAG Lab Results  Component Value Date   INR 1.09 10/09/2018   INR 1.06 11/24/2015   INR 1.1 01/06/2014    Radiology No results found.   Assessment/Plan Hypertension blood pressure control important in reducing the progression of atherosclerotic disease. On appropriate oral medications.   Type 2 diabetes mellitus with stage 2 chronic kidney disease, with long-term current use of insulin (HCC) blood glucose control important in reducing the progression of atherosclerotic disease. Also, involved in wound healing. On appropriate medications.  Bilateral carotid artery stenosis Carotid duplex today reveals a widely patent left carotid endarterectomy and velocities in the right carotid artery that would fall just into the 40 to 59% range.  Continue current medical regimen.  No role for intervention at this point.  Recheck in 1 year.    Festus BarrenJason Dew, MD  08/13/2019 10:05 AM    This note was created with Dragon medical transcription system.  Any errors from dictation are purely unintentional

## 2019-08-13 NOTE — Assessment & Plan Note (Signed)
Carotid duplex today reveals a widely patent left carotid endarterectomy and velocities in the right carotid artery that would fall just into the 40 to 59% range.  Continue current medical regimen.  No role for intervention at this point.  Recheck in 1 year.

## 2020-08-15 ENCOUNTER — Ambulatory Visit (INDEPENDENT_AMBULATORY_CARE_PROVIDER_SITE_OTHER): Payer: Medicare HMO | Admitting: Vascular Surgery

## 2020-08-15 ENCOUNTER — Encounter (INDEPENDENT_AMBULATORY_CARE_PROVIDER_SITE_OTHER): Payer: Medicare HMO

## 2020-09-06 ENCOUNTER — Encounter (INDEPENDENT_AMBULATORY_CARE_PROVIDER_SITE_OTHER): Payer: Self-pay | Admitting: Nurse Practitioner

## 2020-09-06 ENCOUNTER — Ambulatory Visit (INDEPENDENT_AMBULATORY_CARE_PROVIDER_SITE_OTHER): Payer: Medicare Other | Admitting: Nurse Practitioner

## 2020-09-06 ENCOUNTER — Other Ambulatory Visit: Payer: Self-pay

## 2020-09-06 ENCOUNTER — Ambulatory Visit (INDEPENDENT_AMBULATORY_CARE_PROVIDER_SITE_OTHER): Payer: Medicare Other

## 2020-09-06 VITALS — BP 140/66 | HR 82 | Ht 77.0 in | Wt 197.0 lb

## 2020-09-06 DIAGNOSIS — E785 Hyperlipidemia, unspecified: Secondary | ICD-10-CM | POA: Diagnosis not present

## 2020-09-06 DIAGNOSIS — I6523 Occlusion and stenosis of bilateral carotid arteries: Secondary | ICD-10-CM | POA: Diagnosis not present

## 2020-09-06 DIAGNOSIS — I10 Essential (primary) hypertension: Secondary | ICD-10-CM

## 2020-09-07 ENCOUNTER — Encounter (INDEPENDENT_AMBULATORY_CARE_PROVIDER_SITE_OTHER): Payer: Self-pay | Admitting: Nurse Practitioner

## 2020-09-07 NOTE — Progress Notes (Addendum)
Subjective:    Patient ID: Ian [name], male    DOB: 1946/11/20, 74 y.o.   MRN: [id number] Chief Complaint  Patient presents with  . Follow-up    U/S follow up    The patient is seen for follow up evaluation of carotid stenosis. The carotid stenosis followed by ultrasound.   The patient denies amaurosis fugax. There is no recent history of TIA symptoms or focal motor deficits. There is no prior documented CVA.  The patient is taking enteric-coated aspirin 81 mg daily.  There is no history of migraine headaches. There is no history of seizures.  The patient has a history of coronary artery disease, no recent episodes of angina or shortness of breath. The patient denies PAD or claudication symptoms. There is a history of hyperlipidemia which is being treated with a statin.    Carotid Duplex done today shows 60 to 79% stenosis in the right internal carotid artery with no restenosis in the left carotid artery post endarterectomy.  The bilateral subclavian arteries have normal flow hemodynamics with pressures consistent with previous studies.  There is retrograde flow within the left vertebral artery.   Review of Systems  Neurological: Positive for tremors.  All other systems reviewed and are negative.      Objective:   Physical Exam Vitals reviewed.  HENT:     Head: Normocephalic.  Neck:     Vascular: No carotid bruit.  Cardiovascular:     Rate and Rhythm: Normal rate and regular rhythm.     Pulses: Normal pulses.  Pulmonary:     Effort: Pulmonary effort is normal.  Neurological:     Mental Status: He is alert and oriented to person, place, and time.     Motor: Weakness present.     Gait: Gait abnormal.  Psychiatric:        Mood and Affect: Mood normal.        Behavior: Behavior normal.        Thought Content: Thought content normal.        Judgment: Judgment normal.     BP 140/66   Pulse 82   Ht 6\' 5"  (1.956 m)   Wt 197 lb (89.4 kg)   BMI 23.36 kg/m    Past Medical History:  Diagnosis Date  . Asthma   . Cellulitis    ? on Lt leg on antibiotics  . Coronary artery disease   . Diabetes mellitus without complication (HCC)    Type 2, insulin dependent  . DVT of leg (deep venous thrombosis) (HCC)    left  . Dysrhythmia   . GERD (gastroesophageal reflux disease)   . History of atrial fibrillation   . History of CEA (carotid endarterectomy)    left side  . Hypertension   . Injury of leg, left    Lt leg reddened and draining on antibiotics  . Myocardial infarction (HCC)   . Pacemaker   . Parkinson's disease (HCC)   . Seasonal allergies     Social History   Socioeconomic History  . Marital status: Married    Spouse name: Not on file  . Number of children: Not on file  . Years of education: Not on file  . Highest education level: Not on file  Occupational History  . Not on file  Tobacco Use  . Smoking status: Never Smoker  . Smokeless tobacco: Never Used  Vaping Use  . Vaping Use: Never used  Substance and Sexual Activity  . Alcohol  use: No  . Drug use: No  . Sexual activity: Not on file  Other Topics Concern  . Not on file  Social History Narrative  . Not on file   Social Determinants of Health   Financial Resource Strain:   . Difficulty of Paying Living Expenses: Not on file  Food Insecurity:   . Worried About Programme researcher, broadcasting/film/video in the Last Year: Not on file  . Ran Out of Food in the Last Year: Not on file  Transportation Needs:   . Lack of Transportation (Medical): Not on file  . Lack of Transportation (Non-Medical): Not on file  Physical Activity:   . Days of Exercise per Week: Not on file  . Minutes of Exercise per Session: Not on file  Stress:   . Feeling of Stress : Not on file  Social Connections:   . Frequency of Communication with Friends and Family: Not on file  . Frequency of Social Gatherings with Friends and Family: Not on file  . Attends Religious Services: Not on file  . Active Member of  Clubs or Organizations: Not on file  . Attends Banker Meetings: Not on file  . Marital Status: Not on file  Intimate Partner Violence:   . Fear of Current or Ex-Partner: Not on file  . Emotionally Abused: Not on file  . Physically Abused: Not on file  . Sexually Abused: Not on file    Past Surgical History:  Procedure Laterality Date  . APPENDECTOMY    . APPLICATION OF A-CELL OF EXTREMITY Left 01/04/2015   Procedure: APPLICATION OF A-CELL  AND VAC ;  Surgeon: Wayland Denis, DO;  Location: [facility];  Service: Plastics;  Laterality: Left;  . CARDIAC CATHETERIZATION    . CAROTID ENDARTERECTOMY Left   . COLONOSCOPY WITH PROPOFOL    . CORONARY ARTERY BYPASS GRAFT    . HERNIA REPAIR     UHR  . I & D EXTREMITY Left 01/04/2015   Procedure: IRRIGATION AND DEBRIDEMENT OF LOWER LEFT LEG WOUND WITH A CELL AND VAC;  Surgeon: Wayland Denis, DO;  Location: [facility];  Service: Plastics;  Laterality: Left;  . IVC FILTER PLACEMENT ([facility] HX)    . PACEMAKER INSERTION  2012   sss  . PACEMAKER INSERTION Left 10/14/2018   Procedure: PACEMAKER CHANGE OUT;  Surgeon: Marcina Millard, MD;  Location: [facility];  Service: Cardiovascular;  Laterality: Left;  . PERIPHERAL VASCULAR CATHETERIZATION N/A 11/06/2015   Procedure: IVC Filter Insertion;  Surgeon: Annice Needy, MD;  Location: [facility];  Service: Cardiovascular;  Laterality: N/A;  . PERIPHERAL VASCULAR CATHETERIZATION N/A 01/18/2016   Procedure: IVC Filter Removal;  Surgeon: Annice Needy, MD;  Location: [facility];  Service: Cardiovascular;  Laterality: N/A;  . SKIN SPLIT GRAFT Left 02/03/2015   Procedure: SKIN GRAFT FROM  LEFT THIGH TO LEFT LEG;  Surgeon: Glenna Fellows, MD;  [facility];  Service: Plastics;  Laterality: Left;  . TOTAL HIP ARTHROPLASTY Right 12/05/2015   Procedure: TOTAL HIP ARTHROPLASTY ANTERIOR APPROACH;  Surgeon: Kennedy Bucker, MD;  Location:  [facility];  Service: Orthopedics;  Laterality: Right;  [name] VASCULAR SURGERY      Family History  Problem Relation Age of Onset  . Diabetes Mother   . Stroke Father   . Deep vein thrombosis Sister     Allergies  Allergen Reactions  . Atorvastatin Other (See Comments)    Muscle aches  .  Zetia [Ezetimibe]        Assessment & Plan:   1. Bilateral carotid artery stenosis Recommend:  Given the patient's asymptomatic subcritical stenosis no further invasive testing or surgery at this time.   Carotid Duplex done today shows 60 to 79% stenosis in the right internal carotid artery with no restenosis in the left carotid artery.   Continue antiplatelet therapy as prescribed Continue management of CAD, HTN and Hyperlipidemia Healthy heart diet,  encouraged exercise at least 4 times per week Follow up in 6 months with duplex ultrasound and physical exam  2. Hyperlipidemia, unspecified hyperlipidemia type Continue statin as ordered and reviewed, no changes at this time   3. Essential hypertension Continue antihypertensive medications as already ordered, these medications have been reviewed and there are no changes at this time.    Current Outpatient Medications on File Prior to Visit  Medication Sig Dispense Refill  . aspirin 81 MG tablet Take 81 mg by mouth daily.    . budesonide-formoterol (SYMBICORT) 80-4.5 MCG/ACT inhaler Inhale 2 puffs into the lungs daily as needed (wheezing).    . carbidopa-levodopa (SINEMET CR) 50-200 MG tablet Take 1 tablet by mouth at bedtime.    . carbidopa-levodopa (SINEMET IR) 25-250 MG tablet Take 1.5 tablets by mouth 3 (three) times daily.     [name] esomeprazole (NEXIUM) 20 MG capsule Take 20 mg by mouth daily at 12 noon.    . insulin detemir (LEVEMIR) 100 UNIT/ML injection Inject 24 Units into the skin daily.     . insulin lispro (HUMALOG KWIKPEN) 100 UNIT/ML KwikPen INJECT 8 UNITS SUBCUTANEOUSLY 3 (THREE) TIMES DAILY WITH MEALS FOR 90 DAYS    . Insulin  Pen Needle (BD PEN NEEDLE NANO U/F) 32G X 4 MM MISC USE AS DIRECTED 4 TIMES A DAY    . lovastatin (MEVACOR) 10 MG tablet Take 10 mg by mouth at bedtime.    . metoprolol tartrate (LOPRESSOR) 50 MG tablet Take 50 mg by mouth at bedtime.     . Multiple Vitamin (MULTIVITAMIN WITH MINERALS) TABS tablet Take 1 tablet by mouth daily.    [name] spironolactone (ALDACTONE) 25 MG tablet Take 25 mg by mouth daily.     No current facility-administered medications on file prior to visit.    There are no Patient Instructions on file for this visit. No follow-ups on file.   Georgiana Spinner, NP

## 2021-03-05 ENCOUNTER — Other Ambulatory Visit (INDEPENDENT_AMBULATORY_CARE_PROVIDER_SITE_OTHER): Payer: Self-pay | Admitting: Nurse Practitioner

## 2021-03-05 ENCOUNTER — Other Ambulatory Visit (INDEPENDENT_AMBULATORY_CARE_PROVIDER_SITE_OTHER): Payer: Self-pay | Admitting: Vascular Surgery

## 2021-03-05 DIAGNOSIS — I6523 Occlusion and stenosis of bilateral carotid arteries: Secondary | ICD-10-CM

## 2021-03-06 ENCOUNTER — Encounter (INDEPENDENT_AMBULATORY_CARE_PROVIDER_SITE_OTHER): Payer: Medicare Other

## 2021-03-06 ENCOUNTER — Ambulatory Visit (INDEPENDENT_AMBULATORY_CARE_PROVIDER_SITE_OTHER): Payer: Medicare Other | Admitting: Vascular Surgery

## 2021-03-22 ENCOUNTER — Encounter (INDEPENDENT_AMBULATORY_CARE_PROVIDER_SITE_OTHER): Payer: Medicare Other

## 2021-03-22 ENCOUNTER — Ambulatory Visit (INDEPENDENT_AMBULATORY_CARE_PROVIDER_SITE_OTHER): Payer: Medicare Other | Admitting: Nurse Practitioner

## 2021-04-10 ENCOUNTER — Ambulatory Visit (INDEPENDENT_AMBULATORY_CARE_PROVIDER_SITE_OTHER): Payer: Medicare Other | Admitting: Vascular Surgery

## 2021-04-10 ENCOUNTER — Encounter (INDEPENDENT_AMBULATORY_CARE_PROVIDER_SITE_OTHER): Payer: Medicare Other

## 2021-04-20 ENCOUNTER — Ambulatory Visit (INDEPENDENT_AMBULATORY_CARE_PROVIDER_SITE_OTHER): Payer: Medicare Other

## 2021-04-20 ENCOUNTER — Other Ambulatory Visit: Payer: Self-pay

## 2021-04-20 ENCOUNTER — Ambulatory Visit (INDEPENDENT_AMBULATORY_CARE_PROVIDER_SITE_OTHER): Payer: Medicare Other | Admitting: Vascular Surgery

## 2021-04-20 DIAGNOSIS — I6523 Occlusion and stenosis of bilateral carotid arteries: Secondary | ICD-10-CM | POA: Diagnosis not present

## 2021-05-25 ENCOUNTER — Ambulatory Visit (INDEPENDENT_AMBULATORY_CARE_PROVIDER_SITE_OTHER): Payer: Medicare Other | Admitting: Vascular Surgery

## 2021-05-25 ENCOUNTER — Other Ambulatory Visit: Payer: Self-pay

## 2021-05-25 VITALS — BP 141/68 | HR 85 | Resp 16 | Ht 77.0 in | Wt 198.0 lb

## 2021-05-25 DIAGNOSIS — E1122 Type 2 diabetes mellitus with diabetic chronic kidney disease: Secondary | ICD-10-CM

## 2021-05-25 DIAGNOSIS — Z794 Long term (current) use of insulin: Secondary | ICD-10-CM

## 2021-05-25 DIAGNOSIS — I6523 Occlusion and stenosis of bilateral carotid arteries: Secondary | ICD-10-CM

## 2021-05-25 DIAGNOSIS — I10 Essential (primary) hypertension: Secondary | ICD-10-CM

## 2021-05-25 DIAGNOSIS — E785 Hyperlipidemia, unspecified: Secondary | ICD-10-CM

## 2021-05-25 DIAGNOSIS — N182 Chronic kidney disease, stage 2 (mild): Secondary | ICD-10-CM

## 2021-05-25 NOTE — Assessment & Plan Note (Signed)
His duplex today shows velocities in the 40 to 59% range in the right carotid artery which are better than his previous study 6 months ago that fell in the 60 to 79% range.  His left carotid endarterectomy site is widely patent.  He is doing well.  He will continue his current medical regimen.  We will do another 67-month check to ensure that we are in a lower degree range, but no surgery is necessary at this time.

## 2021-05-25 NOTE — Assessment & Plan Note (Signed)
lipid control important in reducing the progression of atherosclerotic disease. Continue statin therapy  

## 2021-05-25 NOTE — Progress Notes (Signed)
MRN : [id number]  HARSHAAN [name] is a 75 y.o. (November 26, 1946) male who presents with chief complaint of  Chief Complaint  Patient presents with   Follow-up    Review utrasound results  .   History of Present Illness:  Patient returns in follow-up of his carotid disease and to review the ultrasound reports from his study last month.  He is doing well.  He continues to have significant tremor and difficulties with his Parkinson's, but no focal neurologic deficits.  His duplex today shows velocities in the 40 to 59% range in the right carotid artery which are better than his previous study 6 months ago that fell in the 60 to 79% range.  His left carotid endarterectomy site is widely patent.  Current Outpatient Medications  Medication Sig Dispense Refill   aspirin 81 MG tablet Take 81 mg by mouth daily.     budesonide-formoterol (SYMBICORT) 80-4.5 MCG/ACT inhaler Inhale 2 puffs into the lungs daily as needed (wheezing).     carbidopa-levodopa (SINEMET CR) 50-200 MG tablet Take 1 tablet by mouth at bedtime.     carbidopa-levodopa (SINEMET IR) 25-250 MG tablet Take 1.5 tablets by mouth 3 (three) times daily.      esomeprazole (NEXIUM) 20 MG capsule Take 20 mg by mouth daily at 12 noon.     insulin detemir (LEVEMIR) 100 UNIT/ML injection Inject 24 Units into the skin daily.      insulin lispro (HUMALOG) 100 UNIT/ML KwikPen INJECT 8 UNITS SUBCUTANEOUSLY 3 (THREE) TIMES DAILY WITH MEALS FOR 90 DAYS     Insulin Pen Needle 32G X 4 MM MISC USE AS DIRECTED 4 TIMES A DAY     lovastatin (MEVACOR) 10 MG tablet Take 10 mg by mouth at bedtime.     metoprolol tartrate (LOPRESSOR) 50 MG tablet Take 50 mg by mouth at bedtime.      Multiple Vitamin (MULTIVITAMIN WITH MINERALS) TABS tablet Take 1 tablet by mouth daily.     spironolactone (ALDACTONE) 25 MG tablet Take 25 mg by mouth daily.     No current facility-administered medications for this visit.    Past Medical History:  Diagnosis Date   Asthma     Cellulitis    ? on Lt leg on antibiotics   Coronary artery disease    Diabetes mellitus without complication (HCC)    Type 2, insulin dependent   DVT of leg (deep venous thrombosis) (HCC)    left   Dysrhythmia    GERD (gastroesophageal reflux disease)    History of atrial fibrillation    History of CEA (carotid endarterectomy)    left side   Hypertension    Injury of leg, left    Lt leg reddened and draining on antibiotics   Myocardial infarction [facility])    Pacemaker    Parkinson's disease (HCC)    Seasonal allergies     Past Surgical History:  Procedure Laterality Date   APPENDECTOMY     APPLICATION OF A-CELL OF EXTREMITY Left 01/04/2015   Procedure: APPLICATION OF A-CELL  AND VAC ;  Surgeon: Wayland Denis, DO;  Location: [facility];  Service: Plastics;  Laterality: Left;   CARDIAC CATHETERIZATION     CAROTID ENDARTERECTOMY Left    COLONOSCOPY WITH PROPOFOL     CORONARY ARTERY BYPASS GRAFT     HERNIA REPAIR     UHR   I & D EXTREMITY Left 01/04/2015   Procedure: IRRIGATION AND DEBRIDEMENT OF LOWER LEFT LEG WOUND WITH  A CELL AND VAC;  Surgeon: Wayland Denis, DO;  Location: [facility];  Service: Plastics;  Laterality: Left;   IVC FILTER PLACEMENT ([facility] HX)     PACEMAKER INSERTION  2012   sss   PACEMAKER INSERTION Left 10/14/2018   Procedure: PACEMAKER CHANGE OUT;  Surgeon: Marcina Millard, MD;  Location: [facility];  Service: Cardiovascular;  Laterality: Left;   PERIPHERAL VASCULAR CATHETERIZATION N/A 11/06/2015   Procedure: IVC Filter Insertion;  Surgeon: Annice Needy, MD;  Location: [facility];  Service: Cardiovascular;  Laterality: N/A;   PERIPHERAL VASCULAR CATHETERIZATION N/A 01/18/2016   Procedure: IVC Filter Removal;  Surgeon: Annice Needy, MD;  Location: [facility];  Service: Cardiovascular;  Laterality: N/A;   SKIN SPLIT GRAFT Left 02/03/2015   Procedure: SKIN GRAFT FROM  LEFT THIGH TO LEFT LEG;  Surgeon: Glenna Fellows, MD;  Location: [facility];  Service: Plastics;  Laterality: Left;   TOTAL HIP ARTHROPLASTY Right 12/05/2015   Procedure: TOTAL HIP ARTHROPLASTY ANTERIOR APPROACH;  Surgeon: Kennedy Bucker, MD;  Location: [facility];  Service: Orthopedics;  Laterality: Right;   VASCULAR SURGERY       Social History   Tobacco Use   Smoking status: Never   Smokeless tobacco: Never  Vaping Use   Vaping Use: Never used  Substance Use Topics   Alcohol use: No   Drug use: No      Family History  Problem Relation Age of Onset   Diabetes Mother    Stroke Father    Deep vein thrombosis Sister      Allergies  Allergen Reactions   Atorvastatin Other (See Comments)    Muscle aches   Zetia [Ezetimibe]      REVIEW OF SYSTEMS (Negative unless checked)   Constitutional: [x] Weight loss  [] Fever  [] Chills Cardiac: [] Chest pain   [] Chest pressure   [] Palpitations   [] Shortness of breath when laying flat   [] Shortness of breath at rest   [] Shortness of breath with exertion. Vascular:  [] Pain in legs with walking   [] Pain in legs at rest   [] Pain in legs when laying flat   [] Claudication   [] Pain in feet when walking  [] Pain in feet at rest  [] Pain in feet when laying flat   [x] History of DVT   [] Phlebitis   [x] Swelling in legs   [] Varicose veins   [] Non-healing ulcers Pulmonary:   [] Uses home oxygen   [x] Productive cough   [] Hemoptysis   [] Wheeze  [] COPD   [] Asthma Neurologic:  [x] Dizziness  [] Blackouts   [] Seizures   [] History of stroke   [] History of TIA  [] Aphasia   [] Temporary blindness   [] Dysphagia   [] Weakness or numbness in arms   [] Weakness or numbness in legs Musculoskeletal:  [x] Arthritis   [] Joint swelling   [x] Joint pain   [] Low back pain Hematologic:  [] Easy bruising  [] Easy bleeding   [] Hypercoagulable state   [] Anemic  [] Hepatitis Gastrointestinal:  [] Blood in stool   [] Vomiting blood  [x] Gastroesophageal reflux/heartburn   [] Difficulty swallowing. Genitourinary:   [x] Chronic kidney disease   [] Difficult urination  [] Frequent urination  [] Burning with urination   [] Blood in urine Skin:  [] Rashes   [] Ulcers   [] Wounds Psychological:  [] History of anxiety   []  History of major depression.    Physical Examination  Vitals:   05/25/21 1004  BP: (!) 141/68  Pulse: 85  Resp: 16  Weight: 198 lb (89.8 kg)  Height: 6\' 5"  (1.956  m)   Body mass index is 23.48 kg/m. Gen:  Thin,  NAD Head: [place]/AT, No temporalis wasting. Ear/Nose/Throat: Hearing grossly intact, nares w/o erythema or drainage, trachea midline Eyes: Conjunctiva clear. Sclera non-icteric Neck: Supple.  Soft right carotid bruit  Pulmonary:  Good air movement, equal and clear to auscultation bilaterally.  Cardiac: RRR, No JVD Vascular:  Vessel Right Left  Radial Palpable Palpable        Musculoskeletal: M/S 5/5 throughout.  No deformity or atrophy.  No edema. Neurologic: CN 2-12 intact. Sensation grossly intact in extremities.  Symmetrical.  Speech is fluent. Motor exam as listed above.  Significant tremor is present. Psychiatric: Judgment intact, Mood & affect appropriate for pt's clinical situation. Dermatologic: No rashes or ulcers noted.  No cellulitis or open wounds.     CBC Lab Results  Component Value Date   WBC 8.1 10/09/2018   HGB 13.0 10/09/2018   HCT 39.9 10/09/2018   MCV 96.8 10/09/2018   PLT 190 10/09/2018    BMET    Component Value Date/Time   NA 138 10/09/2018 1500   NA 136 01/06/2014 0502   K 4.7 10/09/2018 1500   K 3.8 01/06/2014 0502   CL 99 10/09/2018 1500   CL 105 01/06/2014 0502   CO2 30 10/09/2018 1500   CO2 28 01/06/2014 0502   GLUCOSE 205 (H) 10/09/2018 1500   GLUCOSE 147 (H) 01/06/2014 0502   BUN 33 (H) 10/09/2018 1500   BUN 12 01/06/2014 0502   CREATININE 1.03 10/09/2018 1500   CREATININE 0.91 01/06/2014 0502   CALCIUM 9.1 10/09/2018 1500   CALCIUM 7.9 (L) 01/06/2014 0502   GFRNONAA >60 10/09/2018 1500   GFRNONAA >60 01/06/2014 0502    GFRAA >60 10/09/2018 1500   GFRAA >60 01/06/2014 0502   CrCl cannot be calculated (Patient's most recent lab result is older than the maximum 21 days allowed.).  COAG Lab Results  Component Value Date   INR 1.09 10/09/2018   INR 1.06 11/24/2015   INR 1.1 01/06/2014    Radiology No results found.   Assessment/Plan Hypertension blood pressure control important in reducing the progression of atherosclerotic disease. On appropriate oral medications.     Type 2 diabetes mellitus with stage 2 chronic kidney disease, with long-term current use of insulin (HCC) blood glucose control important in reducing the progression of atherosclerotic disease. Also, involved in wound healing. On appropriate medications.  Hyperlipidemia lipid control important in reducing the progression of atherosclerotic disease. Continue statin therapy   Bilateral carotid artery stenosis His duplex today shows velocities in the 40 to 59% range in the right carotid artery which are better than his previous study 6 months ago that fell in the 60 to 79% range.  His left carotid endarterectomy site is widely patent.  He is doing well.  He will continue his current medical regimen.  We will do another 84-month check to ensure that we are in a lower degree range, but no surgery is necessary at this time.    Festus Barren, MD  05/25/2021 11:14 AM    This note was created with Dragon medical transcription system.  Any errors from dictation are purely unintentional

## 2021-11-23 ENCOUNTER — Ambulatory Visit (INDEPENDENT_AMBULATORY_CARE_PROVIDER_SITE_OTHER): Payer: Medicare Other | Admitting: Vascular Surgery

## 2021-11-23 ENCOUNTER — Encounter (INDEPENDENT_AMBULATORY_CARE_PROVIDER_SITE_OTHER): Payer: Medicare Other

## 2022-02-15 ENCOUNTER — Other Ambulatory Visit (INDEPENDENT_AMBULATORY_CARE_PROVIDER_SITE_OTHER): Payer: Self-pay | Admitting: Nurse Practitioner

## 2022-10-07 ENCOUNTER — Encounter (INDEPENDENT_AMBULATORY_CARE_PROVIDER_SITE_OTHER): Payer: Self-pay

## 2023-04-08 ENCOUNTER — Other Ambulatory Visit: Payer: Self-pay | Admitting: Neurology

## 2023-04-08 DIAGNOSIS — G20A1 Parkinson's disease without dyskinesia, without mention of fluctuations: Secondary | ICD-10-CM

## 2023-04-28 ENCOUNTER — Ambulatory Visit: Payer: Medicare HMO

## 2023-09-12 ENCOUNTER — Other Ambulatory Visit: Payer: Self-pay | Admitting: Neurology

## 2023-09-12 ENCOUNTER — Other Ambulatory Visit: Payer: Self-pay | Admitting: Student

## 2023-09-12 ENCOUNTER — Ambulatory Visit
Admission: RE | Admit: 2023-09-12 | Discharge: 2023-09-12 | Disposition: A | Discharge status: Home / Self Care | Payer: Medicare HMO | Source: Ambulatory Visit | Attending: Student | Admitting: Student

## 2023-09-12 DIAGNOSIS — G20A1 Parkinson's disease without dyskinesia, without mention of fluctuations: Secondary | ICD-10-CM
